# Patient Record
Sex: Female | Born: 1985 | Race: White | Hispanic: No | Marital: Single | Smoking: Never smoker
Health system: Southern US, Community
[De-identification: ages and names within clinical notes are randomized; demographics above are authoritative.]

## PROBLEM LIST (undated history)

## (undated) DIAGNOSIS — Z8619 Personal history of other infectious and parasitic diseases: Secondary | ICD-10-CM

## (undated) HISTORY — DX: Personal history of other infectious and parasitic diseases: Z86.19

---
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
## 2011-03-19 ENCOUNTER — Encounter: Payer: Self-pay | Admitting: Internal Medicine

## 2011-03-19 ENCOUNTER — Ambulatory Visit (INDEPENDENT_AMBULATORY_CARE_PROVIDER_SITE_OTHER): Payer: BC Managed Care – PPO | Admitting: Internal Medicine

## 2011-03-19 ENCOUNTER — Ambulatory Visit: Payer: Self-pay | Admitting: Internal Medicine

## 2011-03-19 VITALS — BP 120/80 | HR 60 | Ht 65.5 in | Wt 140.0 lb

## 2011-03-19 DIAGNOSIS — Z309 Encounter for contraceptive management, unspecified: Secondary | ICD-10-CM

## 2011-03-19 DIAGNOSIS — Z113 Encounter for screening for infections with a predominantly sexual mode of transmission: Secondary | ICD-10-CM

## 2011-03-19 DIAGNOSIS — IMO0001 Reserved for inherently not codable concepts without codable children: Secondary | ICD-10-CM | POA: Insufficient documentation

## 2011-03-19 DIAGNOSIS — Z0000 Encounter for general adult medical examination without abnormal findings: Secondary | ICD-10-CM

## 2011-03-19 LAB — POCT URINALYSIS DIPSTICK
Bilirubin, UA: NEGATIVE
Glucose, UA: NEGATIVE
Ketones, UA: NEGATIVE
Leukocytes, UA: NEGATIVE
Protein, UA: NEGATIVE

## 2011-03-19 LAB — CBC WITH DIFFERENTIAL/PLATELET
Eosinophils Relative: 0.9 % (ref 0.0–5.0)
HCT: 37.8 % (ref 36.0–46.0)
Hemoglobin: 13 g/dL (ref 12.0–15.0)
Lymphs Abs: 1.1 10*3/uL (ref 0.7–4.0)
MCV: 92.5 fl (ref 78.0–100.0)
Monocytes Absolute: 0.2 10*3/uL (ref 0.1–1.0)
Monocytes Relative: 5.4 % (ref 3.0–12.0)
Neutro Abs: 2.9 10*3/uL (ref 1.4–7.7)
RDW: 12.5 % (ref 11.5–14.6)
WBC: 4.3 10*3/uL — ABNORMAL LOW (ref 4.5–10.5)

## 2011-03-19 LAB — HEPATIC FUNCTION PANEL
ALT: 16 U/L (ref 0–35)
AST: 18 U/L (ref 0–37)
Albumin: 4.3 g/dL (ref 3.5–5.2)

## 2011-03-19 LAB — TSH: TSH: 1.18 u[IU]/mL (ref 0.35–5.50)

## 2011-03-19 LAB — BASIC METABOLIC PANEL
BUN: 15 mg/dL (ref 6–23)
CO2: 29 mEq/L (ref 19–32)
Chloride: 103 mEq/L (ref 96–112)
Glucose, Bld: 69 mg/dL — ABNORMAL LOW (ref 70–99)
Potassium: 4 mEq/L (ref 3.5–5.1)

## 2011-03-19 LAB — LIPID PANEL: Cholesterol: 143 mg/dL (ref 0–200)

## 2011-03-19 MED ORDER — NORETHINDRONE ACET-ETHINYL EST 1-20 MG-MCG PO TABS: 1.0000 | ORAL_TABLET | Freq: Every day | ORAL | Status: DC

## 2011-03-19 NOTE — Patient Instructions (Signed)
We'll let you know about lab tests. Recommend followup visit to do Pap smear and medication check in about 3 months. Return for any immunizations needed and PPD  As needed.

## 2011-03-19 NOTE — Progress Notes (Signed)
Subjective:    Patient ID: Barbara Cuevas, female    DOB: 05-13-1986, 25 y.o.   MRN: [id number]  HPI Patient comes in today to establish as a new patient visit. She has not been seen here in years. 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  She has no major illnesses chronic disease is and is not on any regular medication. Nonsmoker some alcohol. 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  She had chickenpox as a child. She states she is up-to-date on her immunization but it is not in the registry and she does not have the record today. Last TB test was over a year ago and was negative.   Has a drivers license, wears seatbelts occasionally uses tanning beds but is decreasing this. It's calcium vitamin D in her diet. Non-smoker .   I sp uses condoms   No other contraception. NO gi gu issues ..never had a PAP.  Past Medical History  Diagnosis Date  . Hx of varicella    History reviewed. No pertinent past surgical history.  reports that she has never smoked. She does not have any smokeless tobacco history on file. She reports that she drinks alcohol. She reports that she does not use illicit drugs. family history includes Arthritis in an unspecified family member; Diabetes type II in an unspecified family member; and Heart disease in her father. No Known Allergies  Review of Systems 12 system review negative.  Periods last 5-7 days has developed 8-10 a year. No migraines nonsmoker. Remote history of headaches history of UTI    Objective:   Physical Exam Physical Exam: Vital signs reviewed [id number]:[id number] is a well-developed well-nourished alert cooperative  white female who appears her stated age in no acute distress.  HEENT: normocephalic  traumatic , Eyes: PERRL EOM's full, conjunctiva clear, Nares: paten,t no deformity discharge or tenderness., Ears: no deformity EAC's clear TMs with normal landmarks.  Mouth: clear OP, no lesions, edema.  Moist mucous membranes. Dentition in adequate repair. NECK: supple without masses, thyromegaly or bruits. CHEST/PULM:  Clear to auscultation and percussion breath sounds equal no wheeze , rales or rhonchi. No chest wall deformities or tenderness. Breast: normal by inspection . No dimpling, discharge, masses, tenderness or discharge . LN: no cervical axillary inguinal adenopathy CV: PMI is nondisplaced, S1 S2 no gallops, murmurs, rubs. Peripheral pulses are full without delay.No JVD .  ABDOMEN: Bowel sounds normal nontender  No guard or rebound, no hepato splenomegal no CVA tenderness.  No hernia. Extremtities:  No clubbing cyanosis or edema, no acute joint swelling or redness no focal atrophy NEURO:  Oriented x3, cranial nerves 3-12 appear to be intact, no obvious focal weakness,gait within normal limits no abnormal reflexes or asymmetrical SKIN: No acute rashes normal turgor, color, no bruising or petechiae. PSYCH: Oriented, good eye contact, no obvious depression anxiety, cognition and judgment appear normal. Form reviewed       Assessment & Plan:  Preventive Health Care Get immunization records plan PPD when she can return for reading.  Counseled about healthy diet ,nutrition, exercise, sun protection, calcium vitamin D and injury prevention.  She should also stop the tanning bed  Contraceptive need   She should continue condoms but if she is interested we have reviewed and given her prescription for oral contraceptives. She is an acceptable candidate. Risk-benefit discussed. She should follow up into three  months for blood pressure check Pap smear and medicine check.  Form completed no restrictions.  To return for ppd .   Labs done cpx with varicella titer and sti screening.

## 2011-03-20 LAB — CHLAMYDIA PROBE AMPLIFICATION, URINE: Chlamydia, Swab/Urine, PCR: NEGATIVE

## 2011-03-23 ENCOUNTER — Ambulatory Visit (INDEPENDENT_AMBULATORY_CARE_PROVIDER_SITE_OTHER): Payer: BC Managed Care – PPO | Admitting: Internal Medicine

## 2011-03-23 ENCOUNTER — Telehealth: Payer: Self-pay | Admitting: *Deleted

## 2011-03-23 DIAGNOSIS — Z111 Encounter for screening for respiratory tuberculosis: Secondary | ICD-10-CM

## 2011-03-23 NOTE — Telephone Encounter (Signed)
Left message for pt to call back  °

## 2011-03-23 NOTE — Telephone Encounter (Signed)
Message copied by Tor Netters on Mon Mar 23, 2011  2:35 PM ------      Message from: [facility], [place] [facility]      Created: Mon Mar 23, 2011  1:31 PM       Advised patient her labs are normal. She is immune to chickenpox in her screening tests are negative ( you can give her a copy any minor abnormalities on the screens are of no clinical significance.)

## 2011-03-25 ENCOUNTER — Ambulatory Visit (INDEPENDENT_AMBULATORY_CARE_PROVIDER_SITE_OTHER): Payer: BC Managed Care – PPO | Admitting: Internal Medicine

## 2011-03-25 DIAGNOSIS — Z0000 Encounter for general adult medical examination without abnormal findings: Secondary | ICD-10-CM

## 2011-03-25 DIAGNOSIS — Z23 Encounter for immunization: Secondary | ICD-10-CM

## 2011-03-25 LAB — TB SKIN TEST

## 2011-03-25 NOTE — Telephone Encounter (Signed)
Pt aware of results 

## 2011-04-01 ENCOUNTER — Encounter: Payer: Self-pay | Admitting: *Deleted

## 2011-06-18 ENCOUNTER — Ambulatory Visit: Payer: BC Managed Care – PPO | Admitting: Internal Medicine

## 2011-06-26 ENCOUNTER — Ambulatory Visit (INDEPENDENT_AMBULATORY_CARE_PROVIDER_SITE_OTHER): Payer: BC Managed Care – PPO | Admitting: Internal Medicine

## 2011-06-26 DIAGNOSIS — Z23 Encounter for immunization: Secondary | ICD-10-CM

## 2011-07-16 ENCOUNTER — Other Ambulatory Visit: Payer: Self-pay | Admitting: Internal Medicine

## 2011-07-16 MED ORDER — NORETHINDRONE ACET-ETHINYL EST 1-20 MG-MCG PO TABS: ORAL_TABLET | ORAL | Status: DC

## 2011-07-16 NOTE — Telephone Encounter (Signed)
Rx sent to pharmacy   

## 2011-07-16 NOTE — Telephone Encounter (Signed)
Pt req refill of norethindrone-ethinyl estradiol (MICROGESTIN 1/20) 1-20 MG-MCG to [facility] in [place], [place]

## 2012-07-19 ENCOUNTER — Ambulatory Visit: Payer: BC Managed Care – PPO | Admitting: Internal Medicine

## 2012-07-21 ENCOUNTER — Encounter: Payer: Self-pay | Admitting: Internal Medicine

## 2012-07-21 ENCOUNTER — Ambulatory Visit (INDEPENDENT_AMBULATORY_CARE_PROVIDER_SITE_OTHER): Payer: BC Managed Care – PPO | Admitting: Internal Medicine

## 2012-07-21 VITALS — BP 112/80 | HR 70 | Temp 98.4°F | Wt 152.0 lb

## 2012-07-21 DIAGNOSIS — R41840 Attention and concentration deficit: Secondary | ICD-10-CM | POA: Insufficient documentation

## 2012-07-21 DIAGNOSIS — Z309 Encounter for contraceptive management, unspecified: Secondary | ICD-10-CM

## 2012-07-21 DIAGNOSIS — Z8659 Personal history of other mental and behavioral disorders: Secondary | ICD-10-CM | POA: Insufficient documentation

## 2012-07-21 HISTORY — DX: Encounter for contraceptive management, unspecified: Z30.9

## 2012-07-21 MED ORDER — NORETHINDRONE ACET-ETHINYL EST 1-20 MG-MCG PO TABS: ORAL_TABLET | ORAL | Status: DC

## 2012-07-21 NOTE — Progress Notes (Signed)
Subjective:    Patient ID: Barbara Cuevas, female    DOB: 07/22/1986, 26 y.o.   MRN: [id number]  HPI Patient comes in for an acute visit  appt time for new problem; last office visit was over a year ago 4/ 12 . Note review showed  Plan to come back for Pap smear in a few months . Still hasn't had a pap and ran out of OCPS .  1 partner used condoms.   She is generally been well; however is now in  nursing school  [employer] and concerned about her ability to be able to  study without distraction. She doesn't really want to be on medicine for her brother had significant ADD with hyperactivity as a young child. Wonders if she has this problem. Never had to study much  To keep up  in HS and did well. As  B's in HS  but never had to take standardized test such as the SAT or AP Then   Never had to study a lot until recently.   First week of school . And decided to come in to discuss this Took anatomy 3 times and finally passed it but she  had difficulty passing.  Does  not she feels from Concept understanding  but because it was difficult to sit down and concentrate without distraction and memorize..  She is also working .  Sleep ok . No significant alcohol tobacco or D. some caffeine. Lives at home    Attached appt.  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    20  Hours per week.    Fails to finish things a lot  All the time .  worse recently does have some stress with a number of things she has to do but denies specific anxiety depression panic attacks. No history of head injuries or headaches. In regard to work in high school with AP courses English she did better with other students around her to make her focus and concentrate on left on her own and had more difficulty.    .  Review of Systems Negative for chest pain shortness of breath vision hearing changes syncope unusual bleeding. Has been on Loestrin and ran out using a for period control and  contraception using condoms at present. Had no side effects would like refill.  Past history family history social history reviewed in the electronic medical record. No FM HX sd arrythmia  Father cabg in 59s     Objective:   Physical Exam BP 112/80  Pulse 70  Temp 98.4 F (36.9 C) (Oral)  Wt 152 lb (68.947 kg)  SpO2 99%  LMP 06/13/2012  Physical Exam: Vital signs reviewed [id number] is a well-developed well-nourished alert cooperative  white female who appears her stated age in no acute distress.  HEENT: normocephalic atraumatic , Eyes: PERRL EOM's full, conjunctiva clear, Nares: paten,t no deformity discharge or tenderness., Ears: no deformity EAC's clear TMs with normal landmarks. Mouth: clear OP, no lesions, edema.  Moist mucous membranes. Dentition in adequate repair. NECK: supple without masses, thyromegaly or bruits. CHEST/PULM:  Clear to auscultation and percussion breath sounds equal no wheeze , rales or rhonchi. No chest wall deformities or tenderness. CV: PMI is nondisplaced, S1 S2 no gallops, murmurs, rubs. Peripheral pulses are full without delay.No JVD .  ABDOMEN: Bowel sounds normal nontender  No guard or rebound, no hepato splenomegal no CVA tenderness.  Extremtities:  No clubbing  cyanosis or edema, no acute joint swelling or redness no focal atrophy NEURO:  Oriented x3, cranial nerves 3-12 appear to be intact, no obvious focal weakness,gait within normal limits no abnormal reflexes or asymmetrical SKIN: No acute rashes normal turgor, color, no bruising or petechiae. PSYCH: Oriented, good eye contact, no obvious depression anxiety, cognition and judgment appear normal. 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 It is  always possible she has an LD but it does not appear that she has a constant problem or an attentional problem.  On the surface it doesn't meet the criteria significant difficulty in  2 areas of her life however on further  description she does have a hard time getting things done has to write everything down.has to push for deadlines  In comparison to her brother the attention was given to him when growing up as more of a problem. To complete the adult scale  And have on return   Contraceptive need acceptable candidate for medicine we'll refill her medicine for 5 months have her come back in 1 month or less with adult scale ADHD screen and we'll do a Pap and pelvic at that time. She's never had a Pap smear.   Total visit > 50% spent counseling and coordinating care

## 2012-07-21 NOTE — Patient Instructions (Signed)
Okay to restart the birth control pills. Fill out the survey   Recheck in 3-4 weeks can do Pap and reevaluate for other interventions.  It is possible you have attentional difficulties but we need more information you might benefit from medication.  Is important that you get adequate sleep nutrition as this affects concentration.

## 2012-08-02 ENCOUNTER — Encounter: Payer: Self-pay | Admitting: Internal Medicine

## 2012-08-02 ENCOUNTER — Ambulatory Visit (INDEPENDENT_AMBULATORY_CARE_PROVIDER_SITE_OTHER): Payer: BC Managed Care – PPO | Admitting: Internal Medicine

## 2012-08-02 ENCOUNTER — Other Ambulatory Visit (HOSPITAL_COMMUNITY)
Admission: RE | Admit: 2012-08-02 | Discharge: 2012-08-02 | Disposition: A | Discharge status: Home / Self Care | Payer: Self-pay | Source: Ambulatory Visit | Attending: Internal Medicine | Admitting: Internal Medicine

## 2012-08-02 VITALS — BP 104/64 | HR 83 | Temp 98.5°F | Ht 66.75 in | Wt 151.0 lb

## 2012-08-02 DIAGNOSIS — Z01419 Encounter for gynecological examination (general) (routine) without abnormal findings: Secondary | ICD-10-CM | POA: Insufficient documentation

## 2012-08-02 DIAGNOSIS — R41840 Attention and concentration deficit: Secondary | ICD-10-CM

## 2012-08-02 DIAGNOSIS — Z113 Encounter for screening for infections with a predominantly sexual mode of transmission: Secondary | ICD-10-CM | POA: Insufficient documentation

## 2012-08-02 DIAGNOSIS — F988 Other specified behavioral and emotional disorders with onset usually occurring in childhood and adolescence: Secondary | ICD-10-CM

## 2012-08-02 DIAGNOSIS — Z0000 Encounter for general adult medical examination without abnormal findings: Secondary | ICD-10-CM

## 2012-08-02 DIAGNOSIS — Z309 Encounter for contraceptive management, unspecified: Secondary | ICD-10-CM

## 2012-08-02 MED ORDER — AMPHETAMINE-DEXTROAMPHET ER 10 MG PO CP24: 10.0000 mg | ORAL_CAPSULE | ORAL | Status: DC

## 2012-08-02 NOTE — Progress Notes (Signed)
  Subjective:    Patient ID: Barbara Cuevas, female    DOB: [birth year], 26 y.o.   MRN: [id number]  HPI Patient comes in today for followup Pap and pelvic exam and consideration for ADHD attentional problems. She has begun the oral contraceptive since last time without significant side effect. She denies any breast lumps or vaginal symptoms. Has never had a Pap and pelvic. One partner condoms  She returns with see ASR S. symptom checklist.   Review of Systems Negative chest pain shortness of breath asthma see past history  problems with concentration reading memory is only distracted. Family history of ADHD  .  Past history family history social history reviewed in the electronic medical record.    Objective:   Physical Exam BP 104/64  Pulse 83  Temp 98.5 F (36.9 C) (Oral)  Ht 5' 6.75" (1.695 m)  Wt 151 lb (68.493 kg)  BMI 23.83 kg/m2  SpO2 98%  LMP 06/13/2012  Well-developed well-nourished in no acute distress cardiac S1-S2 no gallops or murmurs breasts intact without discharge dimpling or discharge. Abdomen soft without organomegaly guarding or rebound Chest:  Clear to A&P without wheezes rales or rhonchi CV:  S1-S2 no gallops or murmurs peripheral perfusion is normal Pelvic:  External GU normal cervix posterior deep looks normal mild vaginal discharge Pap done bimanual negative CMT adnexa clear no masses felt. Neurologic nonfocal no fidgeting normal eye contact. See ASR S. symptom checklist  ASRS check list    4/6 in significant  Level   6/18 sx list of sx     Assessment & Plan:  Well woman exam normal Pap STI screen Oral contraceptives monitoring continue.  Attentional difficulties family history difficulties in the past for not significant because she was never challenged to sit down and study and now is having difficulties concentrating does not appear to be from anxiety psychiatric disease or other cause. Discussed option of medicationwe can try low dose Adderall XR  10 mg  which may not be enough increase as tolerated we also discussed psychoeducational testing or evaluation for strength and weaknesses rule out processing speed defects.   Will consider this if we are not getting anywhere with medication.Discussed risk benefit of medication.  All the implications of using a schedule 2 drug.  Prescription security et Karie Soda. Patient lives at home and is well aware.

## 2012-08-02 NOTE — Patient Instructions (Signed)
Will contact you when pap results back. Can start the adderrall xr as discussed at a low dose . ROV in about a month. If no effect or concern about side effects .  Call earlier .and consideration of dose adjustment.

## 2012-08-04 ENCOUNTER — Encounter: Payer: Self-pay | Admitting: Internal Medicine

## 2012-08-06 DIAGNOSIS — F988 Other specified behavioral and emotional disorders with onset usually occurring in childhood and adolescence: Secondary | ICD-10-CM | POA: Insufficient documentation

## 2012-08-06 DIAGNOSIS — Z0000 Encounter for general adult medical examination without abnormal findings: Secondary | ICD-10-CM | POA: Insufficient documentation

## 2012-08-08 NOTE — Progress Notes (Signed)
Quick Note:  Tell patient PAP is normal. Neg STi screen There were some yeast So if has vaginitis sx such as dischage itching Treat with OTC miconizole vaginal or similar Medications. ______

## 2012-08-23 ENCOUNTER — Ambulatory Visit: Payer: BC Managed Care – PPO | Admitting: Internal Medicine

## 2013-02-01 ENCOUNTER — Other Ambulatory Visit (INDEPENDENT_AMBULATORY_CARE_PROVIDER_SITE_OTHER): Payer: BC Managed Care – PPO | Admitting: Family Medicine

## 2013-02-01 ENCOUNTER — Ambulatory Visit: Payer: BC Managed Care – PPO | Admitting: Family Medicine

## 2013-02-01 DIAGNOSIS — Z111 Encounter for screening for respiratory tuberculosis: Secondary | ICD-10-CM

## 2013-02-01 MED ORDER — AMPHETAMINE-DEXTROAMPHET ER 10 MG PO CP24: 10.0000 mg | ORAL_CAPSULE | ORAL | Status: DC

## 2013-02-03 ENCOUNTER — Ambulatory Visit: Payer: BC Managed Care – PPO | Admitting: Internal Medicine

## 2013-02-10 ENCOUNTER — Ambulatory Visit: Payer: BC Managed Care – PPO | Admitting: Internal Medicine

## 2013-02-10 ENCOUNTER — Ambulatory Visit (INDEPENDENT_AMBULATORY_CARE_PROVIDER_SITE_OTHER): Payer: BC Managed Care – PPO | Admitting: Internal Medicine

## 2013-02-10 ENCOUNTER — Encounter: Payer: Self-pay | Admitting: Internal Medicine

## 2013-02-10 VITALS — BP 94/72 | HR 82 | Temp 99.0°F | Wt 154.0 lb

## 2013-02-10 DIAGNOSIS — Z111 Encounter for screening for respiratory tuberculosis: Secondary | ICD-10-CM

## 2013-02-10 DIAGNOSIS — F988 Other specified behavioral and emotional disorders with onset usually occurring in childhood and adolescence: Secondary | ICD-10-CM

## 2013-02-10 DIAGNOSIS — Z79899 Other long term (current) drug therapy: Secondary | ICD-10-CM

## 2013-02-10 MED ORDER — AMPHETAMINE-DEXTROAMPHET ER 10 MG PO CP24: 10.0000 mg | ORAL_CAPSULE | ORAL | Status: DC

## 2013-02-10 NOTE — Progress Notes (Signed)
Chief Complaint  Patient presents with  . Follow-up    Meds.  Also needs a tb skin test  . ADHD    HPI: Here today  For See last visit 9 13  Never came back fro fu  Because  No insuance and couldn't get med filled    and then  Had  Weather related cancellations  Adhd: mom didn't have  insurance in between .  Visits  just started taking prn recently "only when needed .  Less than 10 days so far .  School nursing school  .   Hard to take tests and studying .  A bit better with focus when takes and less distraction.     Didn't do that well last semester  No significant side effects such as major sleep issues and mood changes, chest pain, shortness of breath, headaches , GI or significant weight loss.  ROS: See pertinent positives and negatives per HPI. No cp sob bleeding depression   Neg tad   Past Medical History  Diagnosis Date  . Hx of varicella     Family History  Problem Relation Age of Onset  . Heart disease Father 26    Open-heart surgery  . Diabetes type II      Parent,Grandparent  . Arthritis      Parent  . ADD / ADHD Brother     History   Social History  . Marital Status: Single    Spouse Name: N/A    Number of Children: N/A  . Years of Education: N/A   Social History Main Topics  . Smoking status: Never Smoker   . Smokeless tobacco: None  . Alcohol Use: Yes     Comment: socially  . Drug Use: No  . Sexually Active: None   Other Topics Concern  . None   Social History Narrative   Currently working  [employer]. 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   Household of 1   Lives and a residence attached to her family's house household of five   Regular sleep  8 hours    neg ets firearms.    Outpatient Encounter Prescriptions as of 02/10/2013  Medication Sig Dispense Refill  . amphetamine-dextroamphetamine (ADDERALL XR) 10 MG 24 hr capsule Take 1 capsule (10 mg total) by mouth every morning.  30 capsule  0  . [DISCONTINUED] amphetamine-dextroamphetamine  (ADDERALL XR) 10 MG 24 hr capsule Take 1 capsule (10 mg total) by mouth every morning.  30 capsule  0  . [DISCONTINUED] norethindrone-ethinyl estradiol (MICROGESTIN,JUNEL,LOESTRIN) 1-20 MG-MCG tablet 1 daily. Pt needs to schedule a follow up appointment before next refill.  28 tablet  5   No facility-administered encounter medications on file as of 02/10/2013.    EXAM:  BP 94/72  Pulse 82  Temp(Src) 99 F (37.2 C) (Oral)  Wt 154 lb (69.854 kg)  BMI 24.31 kg/m2  SpO2 98%  LMP 12/13/2012  Body mass index is 24.31 kg/(m^2).  GENERAL: vitals reviewed and listed above, alert, oriented, appears well hydrated and in no acute distress  HEENT: atraumatic, conjunctiva  clear, no obvious abnormalities on inspection of external nose and ears   NECK: no obvious masses on inspection palpation  noadenopathy  LUNGS: clear to auscultation bilaterally, no wheezes, rales or rhonchi, good air movement  CV: HRRR, no clubbing cyanosis or  peripheral edema nl cap refill  Abdomen:  Sof,t normal bowel sounds without hepatosplenomegaly, no guarding rebound or masses no CVA tenderness  MS: moves all extremities without noticeable focal  abnormality  PSYCH: pleasant and cooperative, no obvious depression or anxiety  ASSESSMENT AND PLAN:  Discussed the following assessment and plan:  ADD (attention deficit disorder) probable   Visit for TB skin test - Plan: TB Skin Test Sx still c/w add adhd    Disc trial use of med  On a regular basis  Is more beneficial to most people  .  Currently no se   Will continue on low dose 10 xr for now and contact us after 3-4 weeks  with option to increase to 15 gm xr  On renewal .   Counseled. Agree more benefit in trial than risk for her . [facility]  May be  helpful -Patient advised to return or notify health care team  if symptoms worsen or persist or new concerns arise.  Patient Instructions  I suggest taking most days  To help with the  attentional problems   Tasks  Every day.     adderall x r 10 mg      Contact us in 3-4 weeks when you are due for refill of about whether you want to remain on the 10 mg or increase to 15 mg XR.   ROV in 2-3 months or as needed.    Neta Mends. Panosh M.D.

## 2013-02-10 NOTE — Patient Instructions (Addendum)
I suggest taking most days  To help with the attentional problems   Tasks  Every day.     adderall x r 10 mg      Contact us in 3-4 weeks when you are due for refill of about whether you want to remain on the 10 mg or increase to 15 mg XR.   ROV in 2-3 months or as needed.

## 2013-02-11 ENCOUNTER — Encounter: Payer: Self-pay | Admitting: Internal Medicine

## 2013-02-11 DIAGNOSIS — Z79899 Other long term (current) drug therapy: Secondary | ICD-10-CM | POA: Insufficient documentation

## 2013-02-24 ENCOUNTER — Encounter: Payer: Self-pay | Admitting: Internal Medicine

## 2013-04-11 ENCOUNTER — Telehealth: Payer: Self-pay | Admitting: Internal Medicine

## 2013-04-11 NOTE — Telephone Encounter (Signed)
Pt stated MD will increase her generic adderall xr 10 mg to 15 mg. Pt stated she was communicating with MD on my chart. Please advise

## 2013-04-12 ENCOUNTER — Other Ambulatory Visit: Payer: Self-pay | Admitting: Family Medicine

## 2013-04-12 MED ORDER — AMPHETAMINE-DEXTROAMPHET ER 15 MG PO CP24: 15.0000 mg | ORAL_CAPSULE | ORAL | Status: DC

## 2013-04-12 NOTE — Telephone Encounter (Signed)
Yes  adderall x r 15  Disp 30

## 2013-04-12 NOTE — Telephone Encounter (Signed)
Left message on personally identified cell informing the patient to pick up at the front desk.

## 2013-06-16 ENCOUNTER — Other Ambulatory Visit: Payer: Self-pay | Admitting: Internal Medicine

## 2013-06-16 ENCOUNTER — Encounter: Payer: Self-pay | Admitting: Internal Medicine

## 2013-06-16 MED ORDER — AMPHETAMINE-DEXTROAMPHET ER 15 MG PO CP24: 15.0000 mg | ORAL_CAPSULE | ORAL | Status: DC

## 2013-06-16 NOTE — Telephone Encounter (Signed)
Ok to refill  30 adderall xr 15 mg  .  Plan fu appt    In September

## 2013-06-26 ENCOUNTER — Telehealth: Payer: Self-pay | Admitting: Internal Medicine

## 2013-06-26 NOTE — Telephone Encounter (Signed)
Pt needs new rx generic adderall xr 15 mg °

## 2013-06-28 ENCOUNTER — Other Ambulatory Visit: Payer: Self-pay | Admitting: Family Medicine

## 2013-06-28 MED ORDER — AMPHETAMINE-DEXTROAMPHET ER 15 MG PO CP24: 15.0000 mg | ORAL_CAPSULE | ORAL | Status: DC

## 2013-06-28 NOTE — Telephone Encounter (Signed)
Left message at below listed number for the pt to pick up at the front desk. 

## 2013-08-10 ENCOUNTER — Encounter: Payer: Self-pay | Admitting: Internal Medicine

## 2013-08-10 ENCOUNTER — Ambulatory Visit (INDEPENDENT_AMBULATORY_CARE_PROVIDER_SITE_OTHER): Payer: BC Managed Care – PPO | Admitting: Internal Medicine

## 2013-08-10 VITALS — BP 104/72 | HR 99 | Temp 97.9°F | Wt 142.0 lb

## 2013-08-10 DIAGNOSIS — Z23 Encounter for immunization: Secondary | ICD-10-CM

## 2013-08-10 DIAGNOSIS — Z79899 Other long term (current) drug therapy: Secondary | ICD-10-CM

## 2013-08-10 DIAGNOSIS — F988 Other specified behavioral and emotional disorders with onset usually occurring in childhood and adolescence: Secondary | ICD-10-CM

## 2013-08-10 MED ORDER — AMPHETAMINE-DEXTROAMPHET ER 15 MG PO CP24: 15.0000 mg | ORAL_CAPSULE | ORAL | Status: DC

## 2013-08-10 NOTE — Patient Instructions (Signed)
Continue same med  Contact us for  Refill  In 3 months  Preventive visit and med check in 6 months

## 2013-08-10 NOTE — Progress Notes (Signed)
Chief Complaint  Patient presents with  . Follow-up    HPI: Comes in today for followup of ADHD medication. Since her last visit she has done pretty well on Adderall XR taking most days but not always .No significant side effects such as major sleep issues and mood changes, chest pain, shortness of breath, headaches , GI or significant weight loss.  Better able to study and during test.   Got  86 on last testing and doing better. 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 ocass HA  No other isg se.  1 caffiene  No alcohol ROS: See pertinent positives and negatives per HPI. No chest pain shortness of breath mood disorder syncope exercise intolerance unusual bleeding. No risk of preg   No neuro sx .  Needs a flu vaccine for clinicals in school.  Past Medical History  Diagnosis Date  . Hx of varicella     Family History  Problem Relation Age of Onset  . Heart disease Father 38    Open-heart surgery  . Diabetes type II      Parent,Grandparent  . Arthritis      Parent  . ADD / ADHD Brother     History   Social History  . Marital Status: Single    Spouse Name: N/A    Number of Children: N/A  . Years of Education: N/A   Social History Main Topics  . Smoking status: Never Smoker   . Smokeless tobacco: None  . Alcohol Use: Yes     Comment: socially  . Drug Use: No  . Sexual Activity: None   Other Topics Concern  . None   Social History Narrative   Currently working  [employer].  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   Household of 1   Lives and a residence attached to her family's house household of five   Regular sleep  8 hours    neg ets firearms.    Outpatient Encounter Prescriptions as of 08/10/2013  Medication Sig Dispense Refill  . amphetamine-dextroamphetamine (ADDERALL XR) 15 MG 24 hr capsule Take 1 capsule (15 mg total) by mouth every morning.  30 capsule  0  . amphetamine-dextroamphetamine (ADDERALL XR) 15 MG  24 hr capsule Take 1 capsule (15 mg total) by mouth every morning.  30 capsule  0  . amphetamine-dextroamphetamine (ADDERALL XR) 15 MG 24 hr capsule Take 1 capsule (15 mg total) by mouth every morning.  30 capsule  0  . [DISCONTINUED] amphetamine-dextroamphetamine (ADDERALL XR) 15 MG 24 hr capsule Take 1 capsule (15 mg total) by mouth every morning.  30 capsule  0  . [DISCONTINUED] amphetamine-dextroamphetamine (ADDERALL XR) 15 MG 24 hr capsule Take 1 capsule (15 mg total) by mouth every morning.  30 capsule  0  . [DISCONTINUED] amphetamine-dextroamphetamine (ADDERALL XR) 15 MG 24 hr capsule Take 15 mg by mouth every morning.       No facility-administered encounter medications on file as of 08/10/2013.    EXAM:  BP 104/72  Pulse 99  Temp(Src) 97.9 F (36.6 C) (Oral)  Wt 142 lb (64.411 kg)  BMI 22.42 kg/m2  SpO2 98%  LMP 07/06/2013  Body mass index is 22.42 kg/(m^2).  GENERAL: vitals reviewed and listed above, alert, oriented, appears well hydrated and in no acute distress HEENT: atraumatic, conjunctiva  clear, no obvious abnormalities on inspection of external nose and ears ONECK: no obvious masses on inspection palpation no bruit or  adenopathy LUNGS: clear to auscultation bilaterally, no wheezes, rales or rhonchi, good air movement CV: HRRR, no clubbing cyanosis or  peripheral edema nl cap refill no gallops or murmurs Abdomen soft without organomegaly guarding or rebound MS: moves all extremities without noticeable focal  abnormality Neurologic cranial nerves III through XII appear intact nonfocal otherwise grossly normal PSYCH: pleasant and cooperative, no obvious depression or anxiety  ASSESSMENT AND PLAN:  Discussed the following assessment and plan:  ADD (attention deficit disorder) probable  - Significant functioning improvement in social and academic on medication without significant side effect. Continue her present meds  3 months given  Medication management  Need for  prophylactic vaccination and inoculation against influenza - Plan: Flu Vaccine QUAD 36+ mos PF IM (Fluarix) Adequate response to med . Side effect ;decrease appetite ,acceptable .Continue at same dose . ROV in 6 months for med check. PV  -Patient advised to return or notify health care team  if symptoms worsen or persist or new concerns arise.  Patient Instructions  Continue same med  Contact us for  Refill  In 3 months  Preventive visit and med check in 6 months      Neta Mends. Aedan Geimer M.D.

## 2013-12-04 ENCOUNTER — Other Ambulatory Visit: Payer: Self-pay | Admitting: Internal Medicine

## 2013-12-05 ENCOUNTER — Other Ambulatory Visit: Payer: Self-pay | Admitting: Family Medicine

## 2013-12-05 MED ORDER — AMPHETAMINE-DEXTROAMPHET ER 15 MG PO CP24: 15.0000 mg | ORAL_CAPSULE | ORAL | Status: DC

## 2014-02-09 ENCOUNTER — Encounter: Payer: BC Managed Care – PPO | Admitting: Internal Medicine

## 2014-03-16 ENCOUNTER — Other Ambulatory Visit: Payer: Self-pay | Admitting: Internal Medicine

## 2014-03-21 ENCOUNTER — Encounter (HOSPITAL_COMMUNITY): Payer: Self-pay | Admitting: Emergency Medicine

## 2014-03-21 DIAGNOSIS — N12 Tubulo-interstitial nephritis, not specified as acute or chronic: Secondary | ICD-10-CM | POA: Insufficient documentation

## 2014-03-21 DIAGNOSIS — Z3202 Encounter for pregnancy test, result negative: Secondary | ICD-10-CM | POA: Insufficient documentation

## 2014-03-21 DIAGNOSIS — Z79899 Other long term (current) drug therapy: Secondary | ICD-10-CM | POA: Insufficient documentation

## 2014-03-21 DIAGNOSIS — R000 Tachycardia, unspecified: Secondary | ICD-10-CM | POA: Insufficient documentation

## 2014-03-21 DIAGNOSIS — Z8619 Personal history of other infectious and parasitic diseases: Secondary | ICD-10-CM | POA: Insufficient documentation

## 2014-03-21 DIAGNOSIS — N390 Urinary tract infection, site not specified: Secondary | ICD-10-CM | POA: Insufficient documentation

## 2014-03-21 LAB — URINALYSIS, ROUTINE W REFLEX MICROSCOPIC
Glucose, UA: NEGATIVE mg/dL
KETONES UR: 15 mg/dL — AB
Nitrite: NEGATIVE
PH: 7.5 (ref 5.0–8.0)
PROTEIN: 100 mg/dL — AB
Specific Gravity, Urine: 1.03 (ref 1.005–1.030)
UROBILINOGEN UA: 1 mg/dL (ref 0.0–1.0)

## 2014-03-21 LAB — URINE MICROSCOPIC-ADD ON

## 2014-03-21 LAB — CBC
HEMATOCRIT: 38 % (ref 36.0–46.0)
Hemoglobin: 13.2 g/dL (ref 12.0–15.0)
MCH: 31.7 pg (ref 26.0–34.0)
MCHC: 34.7 g/dL (ref 30.0–36.0)
MCV: 91.3 fL (ref 78.0–100.0)
Platelets: 204 10*3/uL (ref 150–400)
RBC: 4.16 MIL/uL (ref 3.87–5.11)
RDW: 11.8 % (ref 11.5–15.5)
WBC: 20.6 10*3/uL — AB (ref 4.0–10.5)

## 2014-03-21 LAB — POC URINE PREG, ED: Preg Test, Ur: NEGATIVE

## 2014-03-21 MED ORDER — ACETAMINOPHEN 325 MG PO TABS
650.0000 mg | ORAL_TABLET | Freq: Once | ORAL | Status: AC
  Administered 2014-03-21: 650 mg via ORAL
  Filled 2014-03-21: qty 2

## 2014-03-21 NOTE — ED Notes (Addendum)
Pt reports she has been on period and has hx of having bad cramps but today pain became worse mostly in LLQ. Reports fever at home of 100.1. Took ibuprofen at 6pm with no relief. Pt reports last BM yesterday but feels like she can not have one today, also having nausea. Denies abnormal discharge.

## 2014-03-22 ENCOUNTER — Emergency Department (HOSPITAL_COMMUNITY)
Admission: EM | Admit: 2014-03-22 | Discharge: 2014-03-22 | Disposition: A | Discharge status: Home / Self Care | Payer: BC Managed Care – PPO | Attending: Emergency Medicine | Admitting: Emergency Medicine

## 2014-03-22 ENCOUNTER — Other Ambulatory Visit: Payer: Self-pay | Admitting: Family Medicine

## 2014-03-22 ENCOUNTER — Emergency Department (HOSPITAL_COMMUNITY): Payer: BC Managed Care – PPO

## 2014-03-22 DIAGNOSIS — N390 Urinary tract infection, site not specified: Secondary | ICD-10-CM

## 2014-03-22 LAB — COMPREHENSIVE METABOLIC PANEL
ALBUMIN: 4.2 g/dL (ref 3.5–5.2)
ALT: 8 U/L (ref 0–35)
AST: 14 U/L (ref 0–37)
Alkaline Phosphatase: 70 U/L (ref 39–117)
BUN: 11 mg/dL (ref 6–23)
CO2: 19 mEq/L (ref 19–32)
Calcium: 9.6 mg/dL (ref 8.4–10.5)
Chloride: 103 mEq/L (ref 96–112)
Creatinine, Ser: 0.68 mg/dL (ref 0.50–1.10)
GFR calc Af Amer: >90 mL/min (ref >90)
Glucose, Bld: 150 mg/dL — ABNORMAL HIGH (ref 70–99)
Potassium: 3.7 mEq/L (ref 3.7–5.3)
Sodium: 139 mEq/L (ref 137–147)
Total Bilirubin: 1.5 mg/dL — ABNORMAL HIGH (ref 0.3–1.2)
Total Protein: 7.4 g/dL (ref 6.0–8.3)

## 2014-03-22 LAB — I-STAT CG4 LACTIC ACID, ED: Lactic Acid, Venous: 1.2 mmol/L (ref 0.5–2.2)

## 2014-03-22 MED ORDER — CEFPODOXIME PROXETIL 100 MG PO TABS: 100.0000 mg | ORAL_TABLET | Freq: Two times a day (BID) | ORAL | Status: DC

## 2014-03-22 MED ORDER — ONDANSETRON HCL 4 MG/2ML IJ SOLN
4.0000 mg | Freq: Once | INTRAMUSCULAR | Status: AC
  Administered 2014-03-22: 4 mg via INTRAVENOUS
  Filled 2014-03-22: qty 2

## 2014-03-22 MED ORDER — DEXTROSE 5 % IV SOLN
1.0000 g | Freq: Once | INTRAVENOUS | Status: AC
  Administered 2014-03-22: 1 g via INTRAVENOUS
  Filled 2014-03-22: qty 10

## 2014-03-22 MED ORDER — ACETAMINOPHEN 325 MG PO TABS
650.0000 mg | ORAL_TABLET | Freq: Once | ORAL | Status: AC
  Administered 2014-03-22: 650 mg via ORAL
  Filled 2014-03-22: qty 2

## 2014-03-22 MED ORDER — KETOROLAC TROMETHAMINE 30 MG/ML IJ SOLN
30.0000 mg | Freq: Once | INTRAMUSCULAR | Status: AC
  Administered 2014-03-22: 30 mg via INTRAVENOUS
  Filled 2014-03-22: qty 1

## 2014-03-22 MED ORDER — SODIUM CHLORIDE 0.9 % IV BOLUS (SEPSIS)
1000.0000 mL | Freq: Once | INTRAVENOUS | Status: AC
  Administered 2014-03-22: 1000 mL via INTRAVENOUS

## 2014-03-22 MED ORDER — AMPHETAMINE-DEXTROAMPHET ER 15 MG PO CP24: 15.0000 mg | ORAL_CAPSULE | ORAL | Status: DC

## 2014-03-22 MED ORDER — ONDANSETRON HCL 4 MG PO TABS: 4.0000 mg | ORAL_TABLET | Freq: Four times a day (QID) | ORAL | Status: DC

## 2014-03-22 NOTE — ED Notes (Signed)
Discussed with the family that US results have to be reviewed by MD before discharge.

## 2014-03-22 NOTE — ED Notes (Signed)
Family at bedside. 

## 2014-03-22 NOTE — ED Notes (Signed)
Patient given ginger ale and instructed to report if nausous.

## 2014-03-22 NOTE — ED Notes (Addendum)
Patient "feels constipated".

## 2014-03-22 NOTE — Discharge Instructions (Signed)

## 2014-03-22 NOTE — ED Notes (Signed)
Discussed with Dr. Theodoro Kalataptiz that I-stat lactic was collected, and if blood cultures need to be collected prior to starting Rocephin.  MD advises not to collect at this time.

## 2014-03-22 NOTE — ED Notes (Signed)
Patient still off the unit for testing.  Provided warm blanket for family.

## 2014-03-22 NOTE — ED Notes (Signed)
No reports of nausea after fluid challenge.

## 2014-03-22 NOTE — ED Notes (Signed)
Discussed plan of care with Dr. Dierdre Highmanpitz.  Patient has ambulated in hallway, will continue to monitor VS.

## 2014-03-22 NOTE — ED Provider Notes (Signed)
CSN: [id number]     Arrival date & time 03/21/14  2214 History   First MD Initiated Contact with Patient 03/22/14 0131     Chief Complaint  Patient presents with  . Abdominal Pain     (Consider location/radiation/quality/duration/timing/severity/associated sxs/prior Treatment) HPI History provided by patient. Started feeling poorly yesterday with fever and chills and lower abdominal discomfort described as cramps and more left-sided. Current menses feels stronger than typical cramps.  No associated back pain. No difficulty urinating. She denies dysuria but does have some urgency and frequency.  No nausea or vomiting. Patient denies history of UTI or bladder infection in the past. She took Motrin this evening and for persistent symptoms presents here for evaluation. She denies any vaginal bleeding or discharge. No history of same.  Past Medical History  Diagnosis Date  . Hx of varicella    History reviewed. No pertinent past surgical history. Family History  Problem Relation Age of Onset  . Heart disease Father [id number]    Open-heart surgery  . Diabetes type II      Parent,Grandparent  . Arthritis      Parent  . ADD / ADHD Brother    History  Substance Use Topics  . Smoking status: Never Smoker   . Smokeless tobacco: Not on file  . Alcohol Use: Yes     Comment: socially   OB History   Grav Para Term Preterm Abortions TAB SAB Ect Mult Living   0 0 0 0 0 0 0 0 0 0      Review of Systems  Constitutional: Negative for fever and chills.  Eyes: Negative for pain.  Respiratory: Negative for shortness of breath.   Cardiovascular: Negative for chest pain.  Gastrointestinal: Positive for abdominal pain.  Genitourinary: Negative for flank pain.  Musculoskeletal: Negative for back pain.  Skin: Negative for rash.  Neurological: Negative for weakness and headaches.  All other systems reviewed and are negative.     Allergies  Review of patient's allergies indicates no known  allergies.  Home Medications   Prior to Admission medications   Medication Sig Start Date End Date Taking? Authorizing Provider  amphetamine-dextroamphetamine (ADDERALL XR) 15 MG 24 hr capsule Take 1 capsule (15 mg total) by mouth every morning. 12/05/13  Yes Madelin HeadingsWanda K Panosh, MD   BP 97/63  Pulse 96  Temp(Src) 98 F (36.7 C) (Oral)  Resp 23  Ht 5\' 3"  (1.6 m)  SpO2 100%  LMP 03/14/2014 Physical Exam  Constitutional: She is oriented to person, place, and time. She appears well-developed and well-nourished.  HENT:  Head: Normocephalic and atraumatic.  Eyes: EOM are normal. Pupils are equal, round, and reactive to light.  Neck: Neck supple.  Cardiovascular: Regular rhythm and intact distal pulses.   Tachycardic  Pulmonary/Chest: Effort normal and breath sounds normal. No respiratory distress.  Abdominal: Soft. Bowel sounds are normal. She exhibits no distension. There is no rebound and no guarding.  Left lower quadrant more so than midline tenderness without rebound or guarding. No right lower quadrant tenderness. No CVA tenderness.  Musculoskeletal: Normal range of motion. She exhibits no edema and no tenderness.  Neurological: She is alert and oriented to person, place, and time. No cranial nerve deficit.  Skin: Skin is warm and dry. No rash noted.    ED Course  Procedures (including critical care time) Labs Review Results for orders placed during the hospital encounter of 03/22/14  CBC      Result Value Ref Range  WBC 20.6 (*) 4.0 - 10.5 K/uL   RBC 4.16  3.87 - 5.11 MIL/uL   Hemoglobin 13.2  12.0 - 15.0 g/dL   HCT 16.1  09.6 - 04.5 %   MCV 91.3  78.0 - 100.0 fL   MCH 31.7  26.0 - 34.0 pg   MCHC 34.7  30.0 - 36.0 g/dL   RDW 40.9  81.1 - 91.4 %   Platelets 204  150 - 400 K/uL  COMPREHENSIVE METABOLIC PANEL      Result Value Ref Range   Sodium 139  137 - 147 mEq/L   Potassium 3.7  3.7 - 5.3 mEq/L   Chloride 103  96 - 112 mEq/L   CO2 19  19 - 32 mEq/L   Glucose, Bld 150  (*) 70 - 99 mg/dL   BUN 11  6 - 23 mg/dL   Creatinine, Ser 7.82  0.50 - 1.10 mg/dL   Calcium 9.6  8.4 - 95.6 mg/dL   Total Protein 7.4  6.0 - 8.3 g/dL   Albumin 4.2  3.5 - 5.2 g/dL   AST 14  0 - 37 U/L   ALT 8  0 - 35 U/L   Alkaline Phosphatase 70  39 - 117 U/L   Total Bilirubin 1.5 (*) 0.3 - 1.2 mg/dL   GFR calc non Af Amer >90  >90 mL/min   GFR calc Af Amer >90  >90 mL/min  URINALYSIS, ROUTINE W REFLEX MICROSCOPIC      Result Value Ref Range   Color, Urine AMBER (*) YELLOW   APPearance TURBID (*) CLEAR   Specific Gravity, Urine 1.030  1.005 - 1.030   pH 7.5  5.0 - 8.0   Glucose, UA NEGATIVE  NEGATIVE mg/dL   Hgb urine dipstick LARGE (*) NEGATIVE   Bilirubin Urine SMALL (*) NEGATIVE   Ketones, ur 15 (*) NEGATIVE mg/dL   Protein, ur 213 (*) NEGATIVE mg/dL   Urobilinogen, UA 1.0  0.0 - 1.0 mg/dL   Nitrite NEGATIVE  NEGATIVE   Leukocytes, UA LARGE (*) NEGATIVE  URINE MICROSCOPIC-ADD ON      Result Value Ref Range   Squamous Epithelial / LPF MANY (*) RARE   WBC, UA TOO NUMEROUS TO COUNT  <3 WBC/hpf   RBC / HPF 11-20  <3 RBC/hpf   Bacteria, UA MANY (*) RARE   Urine-Other MUCOUS PRESENT    POC URINE PREG, ED      Result Value Ref Range   Preg Test, Ur NEGATIVE  NEGATIVE  I-STAT CG4 LACTIC ACID, ED      Result Value Ref Range   Lactic Acid, Venous 1.20  0.5 - 2.2 mmol/L   US Transvaginal Non-ob  03/22/2014   CLINICAL DATA:  Left pelvic pain.  EXAM: TRANSABDOMINAL AND TRANSVAGINAL ULTRASOUND OF PELVIS  TECHNIQUE: Both transabdominal and transvaginal ultrasound examinations of the pelvis were performed. Transabdominal technique was performed for global imaging of the pelvis including uterus, ovaries, adnexal regions, and pelvic cul-de-sac. It was necessary to proceed with endovaginal exam following the transabdominal exam to visualize the ovaries and endometrium.  COMPARISON:  None  FINDINGS: Uterus  Measurements: 8.4 x 3.4 x 4.8 cm, anteverted. No fibroids or other mass visualized.   Endometrium  Thickness: 5 mm.  No focal abnormality visualized.  Right ovary  Measurements: 3.9 x 2.1 x 1.8 cm. Normal appearance/no adnexal mass.  Left ovary  Measurements: 3.5 x 2.3 x 2.8 cm. Normal appearance/no adnexal mass.  Other findings  No free pelvic  fluid collections. Incidentally images obtained of the bladder demonstrate filling defects layering in the bladder consistent with debris. This could indicate hemorrhage or infection.  IMPRESSION: Normal ultrasound appearance of the uterus and ovaries. Debris in the urinary bladder suggesting infection or hemorrhage.   Electronically Signed   By: Burman NievesWilliam  Stevens M.D.   On: 03/22/2014 04:02   [place] Pelvis Complete  03/22/2014   CLINICAL DATA:  Left pelvic pain.  EXAM: TRANSABDOMINAL AND TRANSVAGINAL ULTRASOUND OF PELVIS  TECHNIQUE: Both transabdominal and transvaginal ultrasound examinations of the pelvis were performed. Transabdominal technique was performed for global imaging of the pelvis including uterus, ovaries, adnexal regions, and pelvic cul-de-sac. It was necessary to proceed with endovaginal exam following the transabdominal exam to visualize the ovaries and endometrium.  COMPARISON:  None  FINDINGS: Uterus  Measurements: 8.4 x 3.4 x 4.8 cm, anteverted. No fibroids or other mass visualized.  Endometrium  Thickness: 5 mm.  No focal abnormality visualized.  Right ovary  Measurements: 3.9 x 2.1 x 1.8 cm. Normal appearance/no adnexal mass.  Left ovary  Measurements: 3.5 x 2.3 x 2.8 cm. Normal appearance/no adnexal mass.  Other findings  No free pelvic fluid collections. Incidentally images obtained of the bladder demonstrate filling defects layering in the bladder consistent with debris. This could indicate hemorrhage or infection.  IMPRESSION: Normal ultrasound appearance of the uterus and ovaries. Debris in the urinary bladder suggesting infection or hemorrhage.   Electronically Signed   By: Burman NievesWilliam  Stevens M.D.   On: 03/22/2014 04:02     IV  fluids and IV Rocephin provided. Tylenol, Toradol On recheck is feeling better and is tolerating by mouth fluids. Patient ambulates to the bathroom  6:08 AM requesting to be discharged home and is feeling much better.  Plan discharge home with outpatient followup. Prescription for antibiotics and antiemetics provided. Strict return precautions verbalized as understood.   MDM   Dx: UTI, early pyelonephritis  Evaluated with labs and imaging reviewed as above. She has significant leukocytosis and obvious UTI on urinalysis. Urine culture is pending. Treated with IV fluids and IV antibiotics. Tachycardia improved. Vital signs and nursing notes reviewed and considered. Systolic blood pressure 90s at times with normal lactate. Feel she is stable for discharge home and outpatient followup.     Sunnie NielsenBrian Jarold Macomber, MD 03/22/14 [phone]

## 2014-03-22 NOTE — ED Notes (Signed)
Warm blanket given

## 2014-03-22 NOTE — ED Notes (Signed)
Patient returned to room from US.  Ambulated to restroom independently.

## 2014-03-22 NOTE — ED Notes (Signed)
Patient transported to Ultrasound 

## 2014-03-23 NOTE — Telephone Encounter (Signed)
Left message to advise pt Rx ready for pick up 

## 2014-04-10 ENCOUNTER — Encounter: Payer: Self-pay | Admitting: Internal Medicine

## 2014-04-10 ENCOUNTER — Other Ambulatory Visit (HOSPITAL_COMMUNITY)
Admission: RE | Admit: 2014-04-10 | Discharge: 2014-04-10 | Disposition: A | Discharge status: Home / Self Care | Payer: BC Managed Care – PPO | Source: Ambulatory Visit | Attending: Internal Medicine | Admitting: Internal Medicine

## 2014-04-10 ENCOUNTER — Ambulatory Visit (INDEPENDENT_AMBULATORY_CARE_PROVIDER_SITE_OTHER): Payer: BC Managed Care – PPO | Admitting: Internal Medicine

## 2014-04-10 VITALS — BP 108/78 | HR 108 | Temp 98.4°F | Ht 66.25 in | Wt 130.0 lb

## 2014-04-10 DIAGNOSIS — Z79899 Other long term (current) drug therapy: Secondary | ICD-10-CM

## 2014-04-10 DIAGNOSIS — Z01419 Encounter for gynecological examination (general) (routine) without abnormal findings: Secondary | ICD-10-CM | POA: Insufficient documentation

## 2014-04-10 DIAGNOSIS — Z0000 Encounter for general adult medical examination without abnormal findings: Secondary | ICD-10-CM

## 2014-04-10 DIAGNOSIS — Z113 Encounter for screening for infections with a predominantly sexual mode of transmission: Secondary | ICD-10-CM | POA: Insufficient documentation

## 2014-04-10 DIAGNOSIS — H1013 Acute atopic conjunctivitis, bilateral: Secondary | ICD-10-CM | POA: Insufficient documentation

## 2014-04-10 DIAGNOSIS — F988 Other specified behavioral and emotional disorders with onset usually occurring in childhood and adolescence: Secondary | ICD-10-CM

## 2014-04-10 DIAGNOSIS — H1045 Other chronic allergic conjunctivitis: Secondary | ICD-10-CM

## 2014-04-10 MED ORDER — AMPHETAMINE-DEXTROAMPHET ER 15 MG PO CP24: 15.0000 mg | ORAL_CAPSULE | ORAL | Status: DC

## 2014-04-10 NOTE — Patient Instructions (Signed)
Continue lifestyle intervention healthy eating and exercise . See eye doc if  Having eye pain or not getting better .  Consider iud for contraception and see gyne. Will notify you  ofPAPwhen available. ROV in 6 months for med check.

## 2014-04-10 NOTE — Progress Notes (Signed)
Chief Complaint  Patient presents with  . Annual Exam  . ADHD  . Gynecologic Exam    HPI: Patient comes in today for Preventive Health Care visit  she was rx in ed in April 2015 for fever abd pain   Had US that was normal. uti esarly pyelo WBC 20 k  Had abd US  rx with ivf and rocephin for?  uti pyelopnepthitis  Ucx was orsered but apparently never sent completed or not in EHRFine now  alelrgies in eyes.  Has contacts. Used drops getting better  ZOX:[id number]:medication No significant side effects such as major sleep issues and mood changes, chest pain, shortness of breath, headaches , GI or significant weight loss. Periods :   Not on ocps .   Using condoms monogamous  No sx  Just graduated .to take tests nad look for job.  Exercise runs neg td   Health Maintenance  Topic Date Due  . Influenza Vaccine  06/30/2014  . Pap Smear  08/03/2015  . Tetanus/tdap  03/24/2021   Health Maintenance Review   ROS:  GEN/ HEENT: No fever, significant weight changes sweats headaches vision problems hearing changes, CV/ PULM; No chest pain shortness of breath cough, syncope,edema  change in exercise tolerance. GI /GU: No adominal pain, vomiting, change in bowel habits. No blood in the stool. No significant GU symptoms.today  SKIN/HEME: ,no acute skin rashes suspicious lesions or bleeding. No lymphadenopathy, nodules, masses.  NEURO/ PSYCH:  No neurologic signs such as weakness numbness. No depression anxiety. IMM/ Allergy: No unusual infections.  Allergy .   REST of 12 system review negative except as per HPI   Past Medical History  Diagnosis Date  . Hx of varicella     Family History  Problem Relation Age of Onset  . Heart disease Father 3055    Open-heart surgery  . Diabetes type II      Parent,Grandparent  . Arthritis      Parent  . ADD / ADHD Brother     History   Social History  . Marital Status: Single    Spouse Name: N/A    Number of Children: N/A  . Years of Education: N/A    Social History Main Topics  . Smoking status: Never Smoker   . Smokeless tobacco: None  . Alcohol Use: Yes     Comment: socially  . Drug Use: No  . Sexual Activity: None   Other Topics Concern  . None   Social History Narrative   Currently working  [employer] [facility] [employer]    n[employer]ing [employer] at [employer] may 15   Household of 1   Lives and a residence attached to her family's house household of five   Regular sleep  8 hours    neg ets firearms.    Outpatient Encounter Prescriptions as of 04/10/2014  Medication Sig  . amphetamine-dextroamphetamine (ADDERALL XR) 15 MG 24 hr capsule Take 1 capsule (15 mg total) by mouth every morning.  . [DISCONTINUED] amphetamine-dextroamphetamine (ADDERALL XR) 15 MG 24 hr capsule Take 1 capsule (15 mg total) by mouth every morning.  Marland Kitchen. amphetamine-dextroamphetamine (ADDERALL XR) 15 MG 24 hr capsule Take 1 capsule (15 mg total) by mouth every morning.  Marland Kitchen. amphetamine-dextroamphetamine (ADDERALL XR) 15 MG 24 hr capsule Take 1 capsule (15 mg total) by mouth every morning.  . [DISCONTINUED] cefpodoxime (VANTIN) 100 MG tablet Take 1 tablet (100 mg total) by mouth 2 (two) times daily.  . [DISCONTINUED] ondansetron (ZOFRAN) 4  MG tablet Take 1 tablet (4 mg total) by mouth every 6 (six) hours.    EXAM:  BP 108/78  Pulse 108  Temp(Src) 98.4 F (36.9 C) (Oral)  Ht 5' 6.25" (1.683 m)  Wt 130 lb (58.968 kg)  BMI 20.82 kg/m2  SpO2 98%  LMP 03/14/2014  Body mass index is 20.82 kg/(m^2).  Physical Exam: Vital signs reviewed NWG:NFAOGEN:This is a well-developed well-nourished alert cooperative    who appearsr stated age in no acute distress.  Reddish eyes no edema  HEENT: normocephalic atraumatic , Eyes: PERRL EOM's full, conjunctiva red no photophobia , Nares: paten,t no deformity discharge or tenderness., Ears: no deformity EAC's clear TMs with normal landmarks. Mouth: clear OP, no lesions, edema.  Moist mucous membranes. Dentition in adequate repair. NECK:  supple without masses, thyromegaly or bruits. CHEST/PULM:  Clear to auscultation and percussion breath sounds equal no wheeze , rales or rhonchi. No chest wall deformities or tenderness. Breast: normal by inspection . No dimpling, discharge, masses, tenderness or discharge . CV: PMI is nondisplaced, S1 S2 no gallops, murmurs, rubs. Peripheral pulses are full without delay.No JVD .  ABDOMEN: Bowel sounds normal nontender  No guard or rebound, no hepato splenomegal no CVA tenderness.  No hernia. Extremtities:  No clubbing cyanosis or edema, no acute joint swelling or redness no focal atrophy NEURO:  Oriented x3, cranial nerves 3-12 appear to be intact, no obvious focal weakness,gait within normal limits no abnormal reflexes or asymmetrical SKIN: No acute rashes normal turgor, color, no bruising or petechiae.some acne on face lower mostly  PSYCH: Oriented, good eye contact, no obvious depression anxiety, cognition and judgment appear normal. LN: no cervical axillary inguinal adenopathy Pelvic: NL ext GU, labia clear without lesions or rash . Vagina no lesions .Cervix: clear  UTERUS: Neg CMT Adnexa:  clear no masses . PAP done [facility] chl screen  Lab Results  Component Value Date   WBC 20.6* 03/21/2014   HGB 13.2 03/21/2014   HCT 38.0 03/21/2014   PLT 204 03/21/2014   GLUCOSE 150* 03/21/2014   CHOL 143 03/19/2011   TRIG 46.0 03/19/2011   HDL 58.70 03/19/2011   LDLCALC 75 03/19/2011   ALT 8 03/21/2014   AST 14 03/21/2014   NA 139 03/21/2014   K 3.7 03/21/2014   CL 103 03/21/2014   CREATININE 0.68 03/21/2014   BUN 11 03/21/2014   CO2 19 03/21/2014   TSH 1.18 03/19/2011    ASSESSMENT AND PLAN:  Discussed the following assessment and plan:  Encounter for preventive health examination - Plan: PAP [[place]]  Visit for gynecologic examination - Plan: PAP [[place]]  ADD (attention deficit disorder) probable  - contineu medication for now  Medication management  Allergic conjunctivitis of both  eyes S/p uti pyelo no sx today disc recheck early if any return of sx.  Adequate response to med . Side effect ;decrease appetite ,acceptable .Continue at same dose . ROV in  6 months for med check. benefit more than risk  Patient Care Team: Madelin HeadingsWanda K Kaydie Petsch, MD as PCP - General (Internal Medicine) Patient Instructions  Continue lifestyle intervention healthy eating and exercise . See eye doc if  Having eye pain or not getting better .  Consider iud for contraception and see gyne. Will notify you  ofPAPwhen available. ROV in 6 months for med check.      Neta MendsWanda K. Saige Busby M.D.  Pre visit review using our clinic review tool, if applicable. No additional management support is  needed unless otherwise documented below in the visit note.

## 2014-04-12 NOTE — Progress Notes (Signed)
Quick Note:  Tell patient PAP is normal. ______ 

## 2014-04-13 ENCOUNTER — Encounter: Payer: Self-pay | Admitting: Family Medicine

## 2014-04-13 ENCOUNTER — Telehealth: Payer: Self-pay | Admitting: Family Medicine

## 2014-04-13 NOTE — Telephone Encounter (Signed)
Sent a letter informing the patient that her pap is normal.  However, yeast was seen.  Need to find out if patient is sx.  [facility] pap is normal But there are yeast  If she is having a sx she can use OTC monistat For 3 -7 days

## 2014-04-16 NOTE — Telephone Encounter (Signed)
Tried reaching the patient and received a message that the mailbox is full.  Will try again at a later time.

## 2014-04-17 NOTE — Telephone Encounter (Signed)
Tried reaching the patient.  Received a message that the mailbox is full.  Will try again at a later time.

## 2014-04-18 NOTE — Telephone Encounter (Signed)
Pt is available now for your to cb

## 2014-04-18 NOTE — Telephone Encounter (Signed)
Left a message for the pt to return my call.  

## 2014-04-19 NOTE — Telephone Encounter (Signed)
Left a message for the pt to return my call.  Will now close the encounter.  Unable to reach the pt.

## 2014-09-14 ENCOUNTER — Other Ambulatory Visit: Payer: Self-pay

## 2014-10-11 ENCOUNTER — Ambulatory Visit: Payer: BC Managed Care – PPO | Admitting: Internal Medicine

## 2014-11-20 ENCOUNTER — Encounter: Payer: Self-pay | Admitting: Internal Medicine

## 2015-02-20 ENCOUNTER — Encounter: Payer: Self-pay | Admitting: Internal Medicine

## 2015-02-20 ENCOUNTER — Ambulatory Visit (INDEPENDENT_AMBULATORY_CARE_PROVIDER_SITE_OTHER): Payer: PRIVATE HEALTH INSURANCE | Admitting: Internal Medicine

## 2015-02-20 VITALS — BP 116/72 | Temp 98.7°F | Ht 66.25 in | Wt 154.0 lb

## 2015-02-20 DIAGNOSIS — L700 Acne vulgaris: Secondary | ICD-10-CM

## 2015-02-20 DIAGNOSIS — Z79899 Other long term (current) drug therapy: Secondary | ICD-10-CM

## 2015-02-20 DIAGNOSIS — F909 Attention-deficit hyperactivity disorder, unspecified type: Secondary | ICD-10-CM | POA: Diagnosis not present

## 2015-02-20 DIAGNOSIS — F988 Other specified behavioral and emotional disorders with onset usually occurring in childhood and adolescence: Secondary | ICD-10-CM

## 2015-02-20 MED ORDER — TRETINOIN 0.05 % EX CREA
TOPICAL_CREAM | Freq: Every day | CUTANEOUS | Status: DC
Start: 2015-02-20 — End: 2016-01-20

## 2015-02-20 MED ORDER — DOXYCYCLINE HYCLATE 100 MG PO TABS: 100.0000 mg | ORAL_TABLET | Freq: Two times a day (BID) | ORAL | Status: DC

## 2015-02-20 MED ORDER — AMPHETAMINE-DEXTROAMPHET ER 20 MG PO CP24: 20.0000 mg | ORAL_CAPSULE | Freq: Every day | ORAL | Status: DC

## 2015-02-20 NOTE — Patient Instructions (Addendum)
Trial of 20 mg adderall x r .   add benzoyl peroxide in am can get creams otc   Retinoid in evening as tolerated . Antibiotic every day  Plan ROV in 2 months for med and acne check  consider adding OC Ps again   Tretinoin skin cream (Acne) What is this medicine? TRETINOIN (TRET i noe in) is a naturally occurring form of vitamin A. It is used on the skin to treat mild to moderate acne. This medicine may be used for other purposes; ask your health care provider or pharmacist if you have questions. COMMON BRAND NAME(S): [place], AVITA, Refissa, Retin-A, Tretin-X What should I tell my health care provider before I take this medicine? They need to know if you have any of these conditions: -eczema -excessive sensitivity to the sun -sunburn -an unusual or allergic reaction to tretinoin, vitamin A, other medicines, foods, dyes, or preservatives -pregnant or trying to get pregnant -breast-feeding How should I use this medicine? This medicine is for external use only. Do not take by mouth. Follow the directions on the prescription label. Gently wash your face with a mild, non-medicated soap before use. Pat the skin dry. Wait 20 to 30 minutes for your skin to dry before use in order to minimize the possibility of skin irritation. Apply enough medicine to cover the affected area and rub in gently. Avoid applying this medicine to your eyes, ears, nostrils, angles of the nose, and mouth. Do not use more often than your doctor or health care professional has recommended. Using too much of this medicine may irritate or increase the irritation of your skin, and will not give faster or better results. Talk to your pediatrician regarding the use of this medicine in children. While this drug may be prescribed for children as young as 50 years of age for selected conditions, precautions do apply. Overdosage: If you think you have taken too much of this medicine contact a poison control center or emergency room  at once. NOTE: This medicine is only for you. Do not share this medicine with others. What if I miss a dose? If you miss a dose, skip that dose and continue with your regular schedule. Do not use extra doses, or use for a longer period of time than directed by your doctor or health care professional. What may interact with this medicine? -medicines or other preparations that may dry your skin such as benzoyl peroxide or salicylic acid -medicines that increase your sensitivity to sunlight such as tetracycline or sulfa drugs This list may not describe all possible interactions. Give your health care provider a list of all the medicines, herbs, non-prescription drugs, or dietary supplements you use. Also tell them if you smoke, drink alcohol, or use illegal drugs. Some items may interact with your medicine. What should I watch for while using this medicine? Your acne may get worse initially and should then start to improve. It may take 2 to 12 weeks before you see the full effect. Do not wash your face more than 2 or 3 times a day, unless directed by your doctor or health care professional. Do not use the following products on the same areas that you are treating with this medicine, unless otherwise directed by your doctor or health care professional: other topical agents with a strong skin drying effect such as products with a high alcohol content, astringents, spices, the peel of lime or other citrus, medicated soaps or shampoos, permanent wave solutions, electrolysis, hair removers or  waxes, or any other preparations or processes that might dry or irritate your skin. This medicine can make you more sensitive to the sun. Keep out of the sun. If you cannot avoid being in the sun, wear protective clothing and use sunscreen. Do not use sun lamps or tanning beds/booths. Avoid cold weather and wind as much as possible, and use clothing to protect you from the weather. Skin treated with this medicine may dry out  or get wind burned more easily. What side effects may I notice from receiving this medicine? Side effects that you should report to your doctor or health care professional as soon as possible: -darkening or lightening of the treated areas -severe burning, itching, crusting, or swelling of the treated areas Side effects that usually do not require medical attention (report to your doctor or health care professional if they continue or are bothersome): -increased sensitivity to the sun -itching -mild stinging -red, inflamed, and irritated skin, the skin may peel after a few days This list may not describe all possible side effects. Call your doctor for medical advice about side effects. You may report side effects to FDA at 1-800-FDA-1088. Where should I keep my medicine? Keep out of the reach of children. Store below 27 degrees C (80 degrees F). Do not freeze. Protect from light. Throw away any unused medicine after the expiration date. NOTE: This sheet is a summary. It may not cover all possible information. If you have questions about this medicine, talk to your doctor, pharmacist, or health care provider.  2015, [employer]vier/[employer] Standard. (2008-08-01 17:38:22)

## 2015-02-20 NOTE — Progress Notes (Signed)
Pre visit review using our clinic review tool, if applicable. No additional management support is needed unless otherwise documented below in the visit note.  Chief Complaint  Patient presents with  . Follow-up    adhd med    . Acne  . ADHD    HPI: Barbara Cuevas 29 y.o. with add on medication overdue for check . Insurance lapse for a short while  Was out of meds and had  No significant side effects such as major sleep issues and mood changes, chest pain, shortness of breath, headaches , GI or significant weight loss. 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 otc no meds  mostly face   Exercise : will runs. Tobacco neg etoh 1-2 per week.  Neg RD.   7-8 hours    ROS: See pertinent positives and negatives per HPI.  Past Medical History  Diagnosis Date  . Hx of varicella     Family History  Problem Relation Age of Onset  . Heart disease Father 43    Open-heart surgery  . Diabetes type II      Parent,Grandparent  . Arthritis      Parent  . ADD / ADHD Brother     History   Social History  . Marital Status: Single    Spouse Name: N/A  . Number of Children: N/A  . Years of Education: N/A   Social History Main Topics  . Smoking status: Never Smoker   . Smokeless tobacco: Not on file  . Alcohol Use: Yes     Comment: socially  . Drug Use: No  . Sexual Activity: Not on file   Other Topics Concern  . None   Social History Narrative   Currently working  [employer] 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    Household of 1   Lives and a residence attached to her family's house household of five   Regular sleep  8 hours    neg ets firearms.    Outpatient Encounter Prescriptions as of 02/20/2015  Medication Sig   . [DISCONTINUED] amphetamine-dextroamphetamine (ADDERALL XR) 15 MG 24 hr capsule Take 1 capsule (15 mg total) by mouth every morning.  . [DISCONTINUED] amphetamine-dextroamphetamine (ADDERALL XR) 15 MG 24 hr capsule Take 1 capsule (15 mg total) by mouth every morning.  . [DISCONTINUED] amphetamine-dextroamphetamine (ADDERALL XR) 15 MG 24 hr capsule Take 1 capsule (15 mg total) by mouth every morning.  Marland Kitchen amphetamine-dextroamphetamine (ADDERALL XR) 20 MG 24 hr capsule Take 1 capsule (20 mg total) by mouth daily.  Marland Kitchen doxycycline (VIBRA-TABS) 100 MG tablet Take 1 tablet (100 mg total) by mouth 2 (two) times daily. For 2 weeks then 1 po qd or as directed  . tretinoin (RETIN-A) 0.05 % cream Apply topically at bedtime. For acne  . [DISCONTINUED] amphetamine-dextroamphetamine (ADDERALL XR) 20 MG 24 hr capsule Take 1 capsule (20 mg total) by mouth daily.    EXAM:  BP 116/72 mmHg  Temp(Src) 98.7 F (37.1 C) (Oral)  Ht 5' 6.25" (1.683 m)  Wt 154 lb (69.854 kg)  BMI 24.66 kg/m2  Body mass index is 24.66 kg/(m^2).  GENERAL: vitals reviewed and listed above, alert, oriented, appears well hydrated and in no acute distress HEENT: atraumatic, conjunctiva  clear, no obvious abnormalities on inspection of external nose and ears OP : no lesion edema or exudate  NECK: no obvious masses on inspection palpation  LUNGS: clear to auscultation bilaterally, no wheezes, rales or rhonchi, good air movement CV: HRRR, no clubbing cyanosis or  peripheral edema nl cap refill  Skin face with moderate inflammatory acne cheeks and chin some scarring  Min on chest body hair seems nl  MS: moves all extremities without noticeable focal  abnormality PSYCH: pleasant and cooperative, no obvious depression or anxiety  ASSESSMENT AND PLAN:  Discussed the following assessment and plan:  ADD (attention deficit disorder) probable   Medication management  Acne vulgaris - mod inflammatory  triple therapy pt wants to wait on   ocps trial again fu in 2 months or as needed cnosdier derm input.  Adequate response to med . Side effect ;decrease appetite ,acceptable .Continue at same dose . ROV in 6 months for med check.  Or cpx  Acne flare  Instructions   Expectant management.  -Patient advised to return or notify health care team  if symptoms worsen ,persist or new concerns arise.  Patient Instructions  Trial of 20 mg adderall x r .   add benzoyl peroxide in am can get creams otc   Retinoid in evening as tolerated . Antibiotic every day  Plan ROV in 2 months for med and acne check  consider adding OC Ps again   Tretinoin skin cream (Acne) What is this medicine? TRETINOIN (TRET i noe in) is a naturally occurring form of vitamin A. It is used on the skin to treat mild to moderate acne. This medicine may be used for other purposes; ask your health care provider or pharmacist if you have questions. COMMON BRAND NAME(S): [place], AVITA, Refissa, Retin-A, Tretin-X What should I tell my health care provider before I take this medicine? They need to know if you have any of these conditions: -eczema -excessive sensitivity to the sun -sunburn -an unusual or allergic reaction to tretinoin, vitamin A, other medicines, foods, dyes, or preservatives -pregnant or trying to get pregnant -breast-feeding How should I use this medicine? This medicine is for external use only. Do not take by mouth. Follow the directions on the prescription label. Gently wash your face with a mild, non-medicated soap before use. Pat the skin dry. Wait 20 to 30 minutes for your skin to dry before use in order to minimize the possibility of skin irritation. Apply enough medicine to cover the affected area and rub in gently. Avoid applying this medicine to your eyes, ears, nostrils, angles of the nose, and mouth. Do not use more often than your doctor or health care professional has recommended. Using too much of this medicine may irritate or increase  the irritation of your skin, and will not give faster or better results. Talk to your pediatrician regarding the use of this medicine in children. While this drug may be prescribed for children as young as 26 years of age for selected conditions, precautions do apply. Overdosage: If you think you have taken too much of this medicine contact a poison control center or emergency room at once. NOTE: This medicine is only for you. Do not share this medicine with others. What if I miss a dose? If you miss a dose, skip that dose and continue with your regular schedule. Do not use extra  doses, or use for a longer period of time than directed by your doctor or health care professional. What may interact with this medicine? -medicines or other preparations that may dry your skin such as benzoyl peroxide or salicylic acid -medicines that increase your sensitivity to sunlight such as tetracycline or sulfa drugs This list may not describe all possible interactions. Give your health care provider a list of all the medicines, herbs, non-prescription drugs, or dietary supplements you use. Also tell them if you smoke, drink alcohol, or use illegal drugs. Some items may interact with your medicine. What should I watch for while using this medicine? Your acne may get worse initially and should then start to improve. It may take 2 to 12 weeks before you see the full effect. Do not wash your face more than 2 or 3 times a day, unless directed by your doctor or health care professional. Do not use the following products on the same areas that you are treating with this medicine, unless otherwise directed by your doctor or health care professional: other topical agents with a strong skin drying effect such as products with a high alcohol content, astringents, spices, the peel of lime or other citrus, medicated soaps or shampoos, permanent wave solutions, electrolysis, hair removers or waxes, or any other preparations or  processes that might dry or irritate your skin. This medicine can make you more sensitive to the sun. Keep out of the sun. If you cannot avoid being in the sun, wear protective clothing and use sunscreen. Do not use sun lamps or tanning beds/booths. Avoid cold weather and wind as much as possible, and use clothing to protect you from the weather. Skin treated with this medicine may dry out or get wind burned more easily. What side effects may I notice from receiving this medicine? Side effects that you should report to your doctor or health care professional as soon as possible: -darkening or lightening of the treated areas -severe burning, itching, crusting, or swelling of the treated areas Side effects that usually do not require medical attention (report to your doctor or health care professional if they continue or are bothersome): -increased sensitivity to the sun -itching -mild stinging -red, inflamed, and irritated skin, the skin may peel after a few days This list may not describe all possible side effects. Call your doctor for medical advice about side effects. You may report side effects to FDA at 1-800-FDA-1088. Where should I keep my medicine? Keep out of the reach of children. Store below 27 degrees C (80 degrees F). Do not freeze. Protect from light. Throw away any unused medicine after the expiration date. NOTE: This sheet is a summary. It may not cover all possible information. If you have questions about this medicine, talk to your doctor, pharmacist, or health care provider.  2015, Elsevier/Gold Standard. (2008-08-01 17:38:22)      Neta MendsWanda K. Makel Mcmann M.D.

## 2015-04-12 ENCOUNTER — Encounter: Payer: PRIVATE HEALTH INSURANCE | Admitting: Internal Medicine

## 2015-04-12 DIAGNOSIS — Z0289 Encounter for other administrative examinations: Secondary | ICD-10-CM

## 2015-04-12 NOTE — Progress Notes (Signed)
Document opened and reviewed for OV but appt  NS same day . See note got wrong date  Coming in thesi week

## 2015-04-17 ENCOUNTER — Telehealth: Payer: Self-pay | Admitting: Internal Medicine

## 2015-04-17 ENCOUNTER — Encounter: Payer: Self-pay | Admitting: Internal Medicine

## 2015-04-17 NOTE — Telephone Encounter (Signed)
Pt thought he appt was this Friday, but it was last Friday and she was a no-show. This is a 2 mo fup and pt would like to come in this Friday. Only same day appt left. Ok to schedule?

## 2015-04-17 NOTE — Telephone Encounter (Signed)
Please advise if okay to use an SDA slot.  That's all you have left on Friday

## 2015-04-18 NOTE — Telephone Encounter (Signed)
Ok to have her come in on sda slot

## 2015-04-19 ENCOUNTER — Ambulatory Visit (INDEPENDENT_AMBULATORY_CARE_PROVIDER_SITE_OTHER): Payer: PRIVATE HEALTH INSURANCE | Admitting: Internal Medicine

## 2015-04-19 ENCOUNTER — Encounter: Payer: Self-pay | Admitting: Internal Medicine

## 2015-04-19 VITALS — BP 110/64 | Temp 98.3°F | Ht 65.75 in | Wt 147.8 lb

## 2015-04-19 DIAGNOSIS — L700 Acne vulgaris: Secondary | ICD-10-CM

## 2015-04-19 DIAGNOSIS — F909 Attention-deficit hyperactivity disorder, unspecified type: Secondary | ICD-10-CM

## 2015-04-19 DIAGNOSIS — Z79899 Other long term (current) drug therapy: Secondary | ICD-10-CM

## 2015-04-19 DIAGNOSIS — F988 Other specified behavioral and emotional disorders with onset usually occurring in childhood and adolescence: Secondary | ICD-10-CM

## 2015-04-19 MED ORDER — AMPHETAMINE-DEXTROAMPHET ER 20 MG PO CP24: 20.0000 mg | ORAL_CAPSULE | Freq: Every day | ORAL | Status: DC

## 2015-04-19 MED ORDER — AMPHETAMINE-DEXTROAMPHET ER 20 MG PO CP24: 20.0000 mg | ORAL_CAPSULE | ORAL | Status: DC

## 2015-04-19 MED ORDER — NORETHINDRONE ACET-ETHINYL EST 1.5-30 MG-MCG PO TABS: 1.0000 | ORAL_TABLET | Freq: Every day | ORAL | Status: DC

## 2015-04-19 NOTE — Telephone Encounter (Signed)
done

## 2015-04-19 NOTE — Patient Instructions (Signed)
Work on change control of   Study setsing environment . Get enough tlseep and some exercise . Study at your best time of day usually ams.  Consider seeing  Counseling for test anxiety if needed.  Stay on the retina  Add  Hormonal therapy again .

## 2015-04-19 NOTE — Progress Notes (Signed)
Pre visit review using our clinic review tool, if applicable. No additional management support is needed unless otherwise documented below in the visit note.   Chief Complaint  Patient presents with  . Follow-up    HPI: Barbara Cuevas 29 y.o. comes in  Fu adhd  Med and acne  Acne Doxy not that helpful just ran out  But using the retina ok   No need for contraception willing to try ocps for acne management. No xh clots  Tobacco or  classic migraine.   adhd adderall xr helps but still having time  wot on line course concentration  Has a good bit of anxiety about the large test taking .  otherwise ok   Bro had add  meds didn t help  Much  ROS: See pertinent positives and negatives per HPI.no cv pulm sx .   Past Medical History  Diagnosis Date  . Hx of varicella     Family History  Problem Relation Age of Onset  . Heart disease Father 455    Open-heart surgery  . Diabetes type II      Parent,Grandparent  . Arthritis      Parent  . ADD / ADHD Brother     History   Social History  . Marital Status: Single    Spouse Name: N/A  . Number of Children: N/A  . Years of Education: N/A   Social History Main Topics  . Smoking status: Never Smoker   . Smokeless tobacco: Not on file  . Alcohol Use: Yes     Comment: socially  . Drug Use: No  . Sexual Activity: Not on file   Other Topics Concern  . 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    Household of 1   Lives and a residence attached to her family's house household of five   Regular sleep  8 hours    neg ets firearms.    Outpatient Prescriptions Prior to Visit  Medication Sig Dispense Refill  . tretinoin (RETIN-A) 0.05 % cream Apply topically at bedtime. For acne 45 g 1  . amphetamine-dextroamphetamine (ADDERALL XR) 20 MG 24 hr capsule Take 1 capsule (20 mg total) by mouth daily. 30 capsule 0  . doxycycline  (VIBRA-TABS) 100 MG tablet Take 1 tablet (100 mg total) by mouth 2 (two) times daily. For 2 weeks then 1 po qd or as directed 60 tablet 1   No facility-administered medications prior to visit.     EXAM:  BP 110/64 mmHg  Temp(Src) 98.3 F (36.8 C) (Oral)  Ht 5' 5.75" (1.67 m)  Wt 147 lb 12.8 oz (67.042 kg)  BMI 24.04 kg/m2  Body mass index is 24.04 kg/(m^2).  GENERAL: vitals reviewed and listed above, alert, oriented, appears well hydrated and in no acute distress HEENT: atraumatic, conjunctiva  clear, no obvious abnormalities on inspection of external nose and ears NECK: no obvious masses on inspection palpation  LUNGS: clear to auscultation bilaterally, no wheezes, rales or rhonchi, good air movement CV: HRRR, no clubbing cyanosis or  peripheral edema nl cap refill  MS: moves all extremities without noticeable focal  Abnormality Skin: normal capillary refill ,turgor , color: No acute rashes ,petechiae or bruising acne face muted  No nodular lesion 10 +  No hair changes  PSYCH: pleasant and cooperative, no obvious depression or anxiety  ASSESSMENT AND PLAN:  Discussed the following assessment and plan:  ADD (attention deficit disorder) probable  - response to med  but will rearrange environment to help  consider counseling about testsanxiety.  Medication management  Acne vulgaris - min response to doxy ok with retina   add ocps  as   and rov in 3 months or contact about status for refill   Can skip appt if doing well and adivsed of such otherwise wellness ov med check in 6 months  optinos disc remain on same dose at this time -Patient advised to return or notify health care team  if symptoms worsen ,persist or new concerns arise.  Patient Instructions  Work on change control of   Study setsing environment . Get enough tlseep and some exercise . Study at your best time of day usually ams.  Consider seeing  Counseling for test anxiety if needed.  Stay on the retina  Add   Hormonal therapy again .     Neta MendsWanda K. Panosh M.D.

## 2015-05-05 IMAGING — US US TRANSVAGINAL NON-OB
1 series · 14 of 25 positions shown · non-contrast
Comparison: None

CLINICAL DATA: Left pelvic pain.

EXAM:
TRANSABDOMINAL AND TRANSVAGINAL ULTRASOUND OF PELVIS
TECHNIQUE: Both transabdominal and transvaginal ultrasound examinations of the
pelvis were performed. Transabdominal technique was performed for
global imaging of the pelvis including uterus, ovaries, adnexal
regions, and pelvic cul-de-sac. It was necessary to proceed with
endovaginal exam following the transabdominal exam to visualize the
ovaries and endometrium.

[Series 1: us transvaginal non-ob · 0.22mm/px · 14 of 37 slices shown]
[im 1/37]
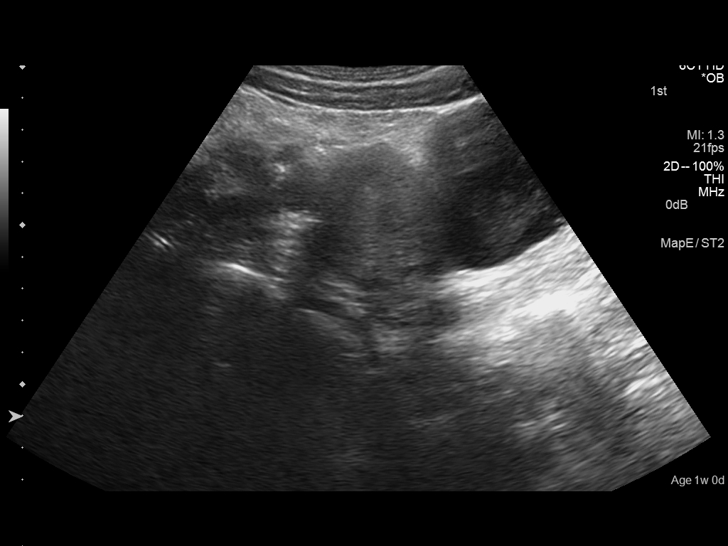
[im 4/37]
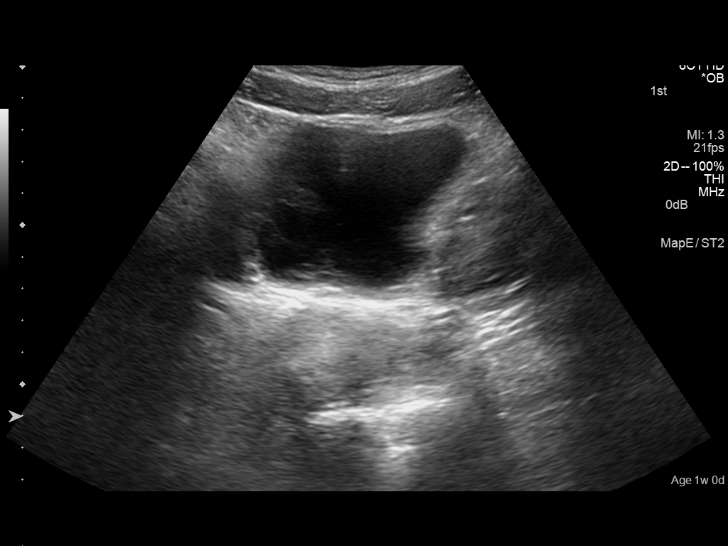
[im 7/37]
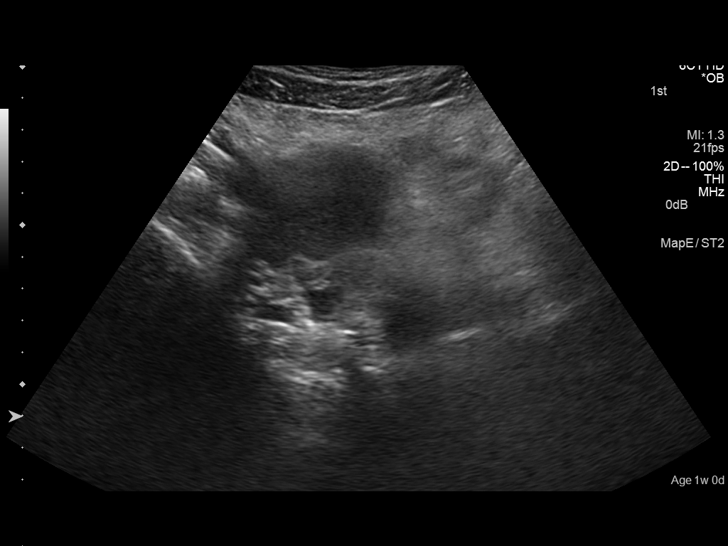
[im 10/37]
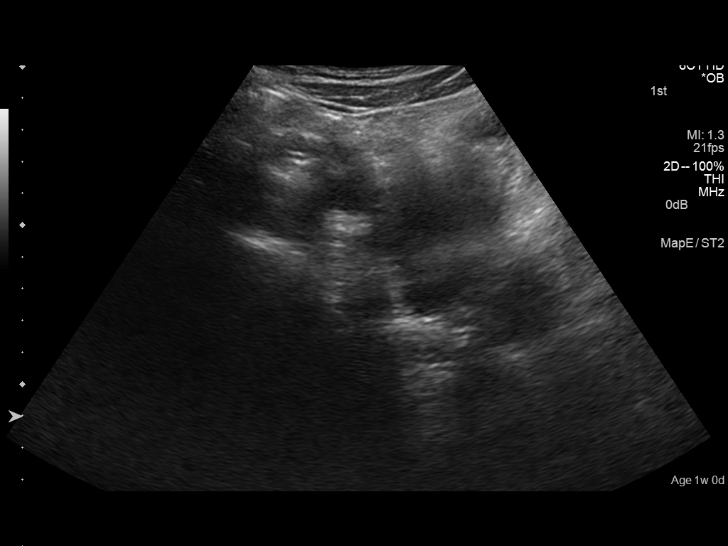
[im 13/37]
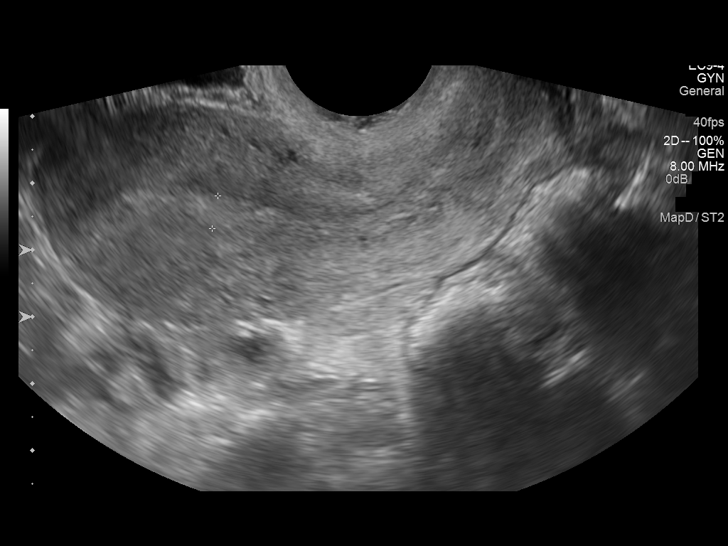
[im 14/37]
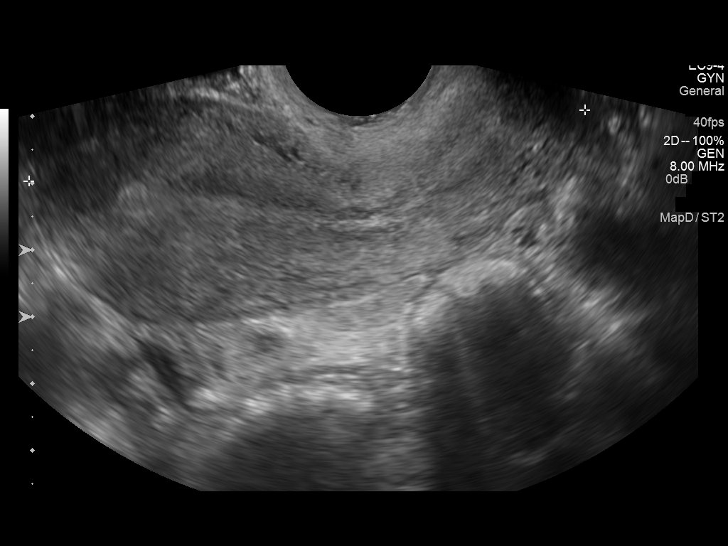
[im 17/37]
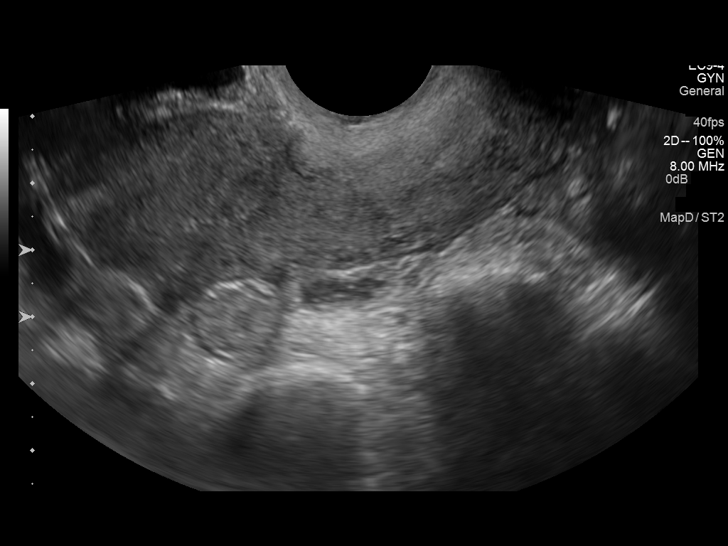
[im 20/37]
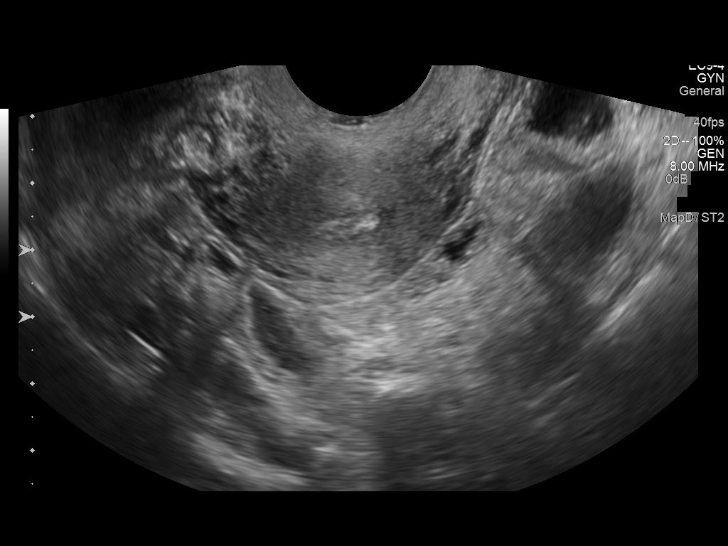
[im 23/37]
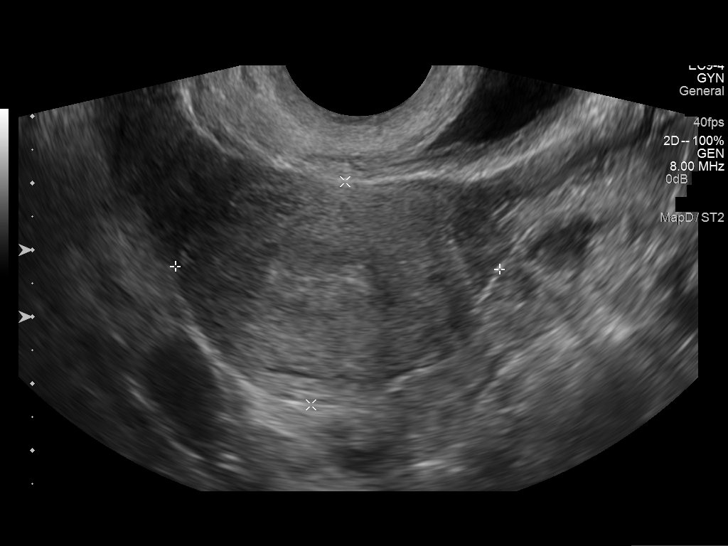
[im 25/37]
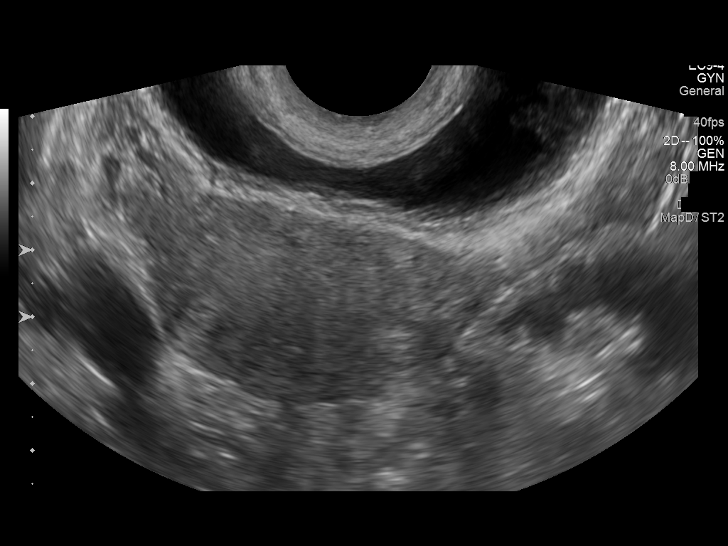
[im 28/37]
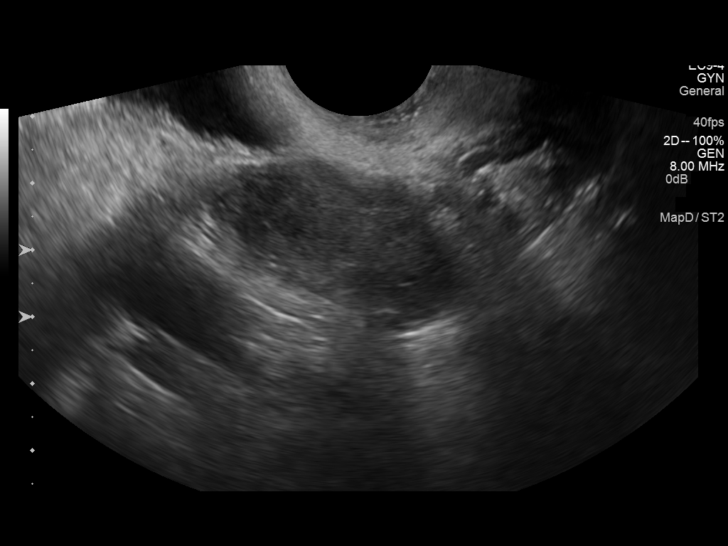
[im 31/37]
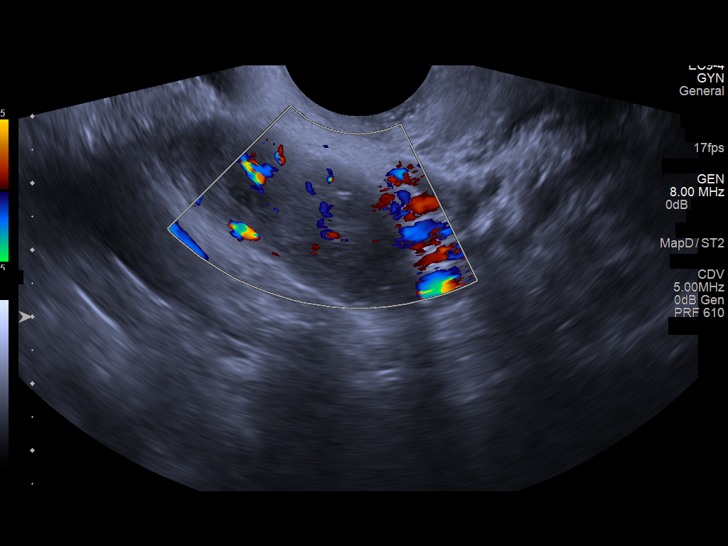
[im 34/37]
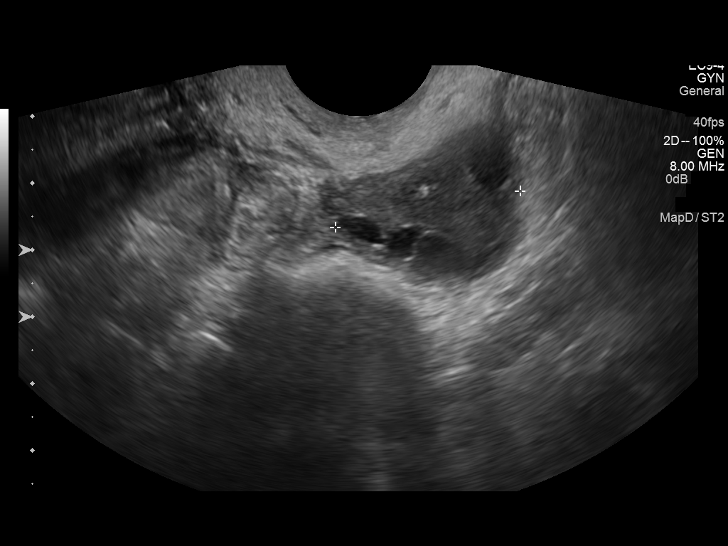
[im 37/37]
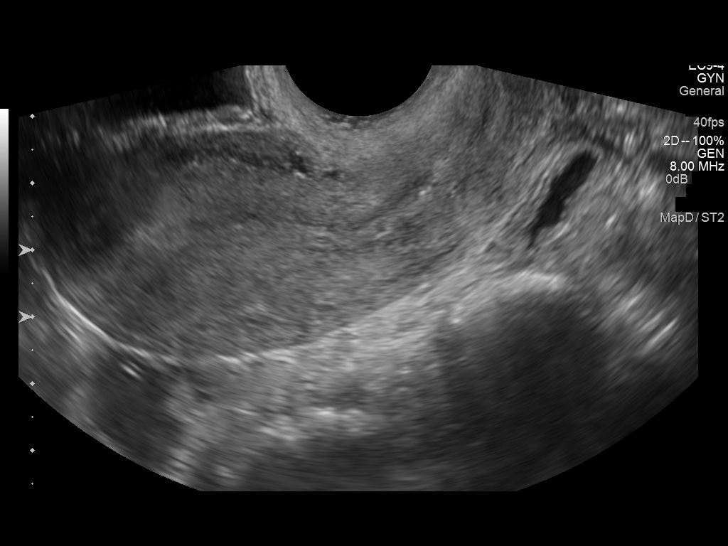

[14 of 25 positions shown; findings below may reference images not displayed]

FINDINGS: Uterus

Measurements: 8.4 x 3.4 x 4.8 cm, anteverted. No fibroids or other
mass visualized.

Endometrium

Thickness: 5 mm.  No focal abnormality visualized.

Right ovary

Measurements: 3.9 x 2.1 x 1.8 cm. Normal appearance/no adnexal mass.

Left ovary

Measurements: 3.5 x 2.3 x 2.8 cm. Normal appearance/no adnexal mass.

Other findings

No free pelvic fluid collections. Incidentally images obtained of
the bladder demonstrate filling defects layering in the bladder
consistent with debris. This could indicate hemorrhage or infection.
IMPRESSION: Normal ultrasound appearance of the uterus and ovaries. Debris in
the urinary bladder suggesting infection or hemorrhage.

## 2015-07-19 ENCOUNTER — Encounter: Payer: Self-pay | Admitting: Internal Medicine

## 2015-07-19 ENCOUNTER — Ambulatory Visit (INDEPENDENT_AMBULATORY_CARE_PROVIDER_SITE_OTHER): Payer: PRIVATE HEALTH INSURANCE | Admitting: Internal Medicine

## 2015-07-19 VITALS — BP 108/74 | Temp 97.7°F | Wt 144.0 lb

## 2015-07-19 DIAGNOSIS — Z79899 Other long term (current) drug therapy: Secondary | ICD-10-CM | POA: Diagnosis not present

## 2015-07-19 DIAGNOSIS — Z0000 Encounter for general adult medical examination without abnormal findings: Secondary | ICD-10-CM | POA: Diagnosis not present

## 2015-07-19 DIAGNOSIS — L700 Acne vulgaris: Secondary | ICD-10-CM | POA: Diagnosis not present

## 2015-07-19 DIAGNOSIS — F909 Attention-deficit hyperactivity disorder, unspecified type: Secondary | ICD-10-CM | POA: Diagnosis not present

## 2015-07-19 DIAGNOSIS — R739 Hyperglycemia, unspecified: Secondary | ICD-10-CM | POA: Diagnosis not present

## 2015-07-19 DIAGNOSIS — F988 Other specified behavioral and emotional disorders with onset usually occurring in childhood and adolescence: Secondary | ICD-10-CM

## 2015-07-19 DIAGNOSIS — Z3041 Encounter for surveillance of contraceptive pills: Secondary | ICD-10-CM

## 2015-07-19 LAB — HEPATIC FUNCTION PANEL
ALT: 18 U/L (ref 0–35)
AST: 18 U/L (ref 0–37)
Albumin: 4.6 g/dL (ref 3.5–5.2)
Alkaline Phosphatase: 66 U/L (ref 39–117)
Bilirubin, Direct: 0.1 mg/dL (ref 0.0–0.3)
Total Bilirubin: 0.6 mg/dL (ref 0.2–1.2)
Total Protein: 7.4 g/dL (ref 6.0–8.3)

## 2015-07-19 LAB — BASIC METABOLIC PANEL
BUN: 13 mg/dL (ref 6–23)
CO2: 29 mEq/L (ref 19–32)
Calcium: 9.4 mg/dL (ref 8.4–10.5)
Chloride: 105 mEq/L (ref 96–112)
Creatinine, Ser: 0.74 mg/dL (ref 0.40–1.20)
GFR: 100.06 mL/min (ref >60.00)
GLUCOSE: 81 mg/dL (ref 70–99)
POTASSIUM: 4.6 meq/L (ref 3.5–5.1)
Sodium: 141 mEq/L (ref 135–145)

## 2015-07-19 LAB — CBC WITH DIFFERENTIAL/PLATELET
Basophils Absolute: 0 10*3/uL (ref 0.0–0.1)
Basophils Relative: 0.4 % (ref 0.0–3.0)
EOS PCT: 1.1 % (ref 0.0–5.0)
Eosinophils Absolute: 0.1 10*3/uL (ref 0.0–0.7)
HCT: 40.9 % (ref 36.0–46.0)
Hemoglobin: 13.9 g/dL (ref 12.0–15.0)
LYMPHS ABS: 1.9 10*3/uL (ref 0.7–4.0)
Lymphocytes Relative: 23.8 % (ref 12.0–46.0)
MCHC: 34 g/dL (ref 30.0–36.0)
MCV: 92.6 fl (ref 78.0–100.0)
Monocytes Absolute: 0.4 10*3/uL (ref 0.1–1.0)
Monocytes Relative: 4.8 % (ref 3.0–12.0)
Neutro Abs: 5.7 10*3/uL (ref 1.4–7.7)
Neutrophils Relative %: 69.9 % (ref 43.0–77.0)
Platelets: 221 10*3/uL (ref 150.0–400.0)
RBC: 4.42 Mil/uL (ref 3.87–5.11)
RDW: 12.3 % (ref 11.5–15.5)
WBC: 8.1 10*3/uL (ref 4.0–10.5)

## 2015-07-19 LAB — LIPID PANEL
Cholesterol: 142 mg/dL (ref 0–200)
HDL: 52.2 mg/dL (ref >39.00)
LDL Cholesterol: 82 mg/dL (ref 0–99)
NonHDL: 90.08
Total CHOL/HDL Ratio: 3
Triglycerides: 41 mg/dL (ref 0.0–149.0)
VLDL: 8.2 mg/dL (ref 0.0–40.0)

## 2015-07-19 LAB — HEMOGLOBIN A1C: Hgb A1c MFr Bld: 5.2 % (ref 4.6–6.5)

## 2015-07-19 LAB — TSH: TSH: 0.87 u[IU]/mL (ref 0.35–4.50)

## 2015-07-19 MED ORDER — AMPHETAMINE-DEXTROAMPHET ER 20 MG PO CP24: 20.0000 mg | ORAL_CAPSULE | ORAL | Status: DC

## 2015-07-19 MED ORDER — NORETHINDRONE ACET-ETHINYL EST 1.5-30 MG-MCG PO TABS: 1.0000 | ORAL_TABLET | Freq: Every day | ORAL | Status: DC

## 2015-07-19 MED ORDER — AMPHETAMINE-DEXTROAMPHET ER 20 MG PO CP24: 20.0000 mg | ORAL_CAPSULE | Freq: Every day | ORAL | Status: DC

## 2015-07-19 NOTE — Patient Instructions (Addendum)
Get [place] form as needed  Continue on same meds for now.  Healthy lifestyle includes : At least 150 minutes of exercise weeks  , weight at healthy levels, which is usually   BMI 19-25. Avoid trans fats and processed foods;  Increase fresh fruits and veges to 5 servings per day. And avoid sweet beverages including tea and juice. Mediterranean diet with olive oil and nuts have been noted to be heart and brain healthy . Avoid tobacco products . Limit  alcohol to  7 per week for women and 14 servings for men.  Get adequate sleep . Wear seat belts . Don't text and drive .   rov in 6 months  Or as needed

## 2015-07-19 NOTE — Progress Notes (Signed)
Pre visit review using our clinic review tool, if applicable. No additional management support is needed unless otherwise documented below in the visit note.   Chief Complaint  Patient presents with  . Follow-up    meds   . Annual Exam  . ADHD    HPI: Patient  Barbara Cuevas  29 y.o. comes in today for Preventive Health Care visit  Needs for work and fu of medication    adhd add:  Medication management .  And beginning ocps for acne    Not that much help so far on acne  Hard to tell.  Had to change pharmacy  Only took 2 months worth needs refill  ? Makes her hngry?  Since last visit : Taking medication:  Adderall.  Most days.  Work .  Took test again yesterday.  School/ grades : just took test again  With med  Feels ok may have passed will find out soon  Sleep :  About 7-8  Diet :ok  Mood : ok  Medication assessment :No significant side effects such as major sleep issues and mood changes, chest pain, shortness of breath, headaches , GI or significant weight loss.    Health Maintenance  Topic Date Due  . INFLUENZA VACCINE  07/01/2015  . TETANUS/TDAP  03/24/2021  . HIV Screening  Completed   Health Maintenance Review LIFESTYLE:  Exercise:  Active  Tobacco/ETS:no Alcohol: per day ocas Sugar beverages:no Sleep:yes Drug use: no  ROS:  GEN/ HEENT: No fever, significant weight changes sweats headaches vision problems hearing changes, CV/ PULM; No chest pain shortness of breath cough, syncope,edema  change in exercise tolerance. GI /GU: No adominal pain, vomiting, change in bowel habits. No blood in the stool. No significant GU symptoms. SKIN/HEME: ,no acute skin rashes suspicious lesions or bleeding. No lymphadenopathy, nodules, masses.  NEURO/ PSYCH:  No neurologic signs such as weakness numbness. No depression anxiety. IMM/ Allergy: No unusual infections.  Allergy .   REST of 12 system review negative except as per HPI   Past Medical History  Diagnosis Date  . Hx of  varicella     No past surgical history on file.  Family History  Problem Relation Age of Onset  . Heart disease Father 64    Open-heart surgery  . Diabetes type II      Parent,Grandparent  . Arthritis      Parent  . ADD / ADHD Brother     Social History   Social History  . Marital Status: Single    Spouse Name: N/A  . Number of Children: N/A  . Years of Education: N/A   Social History Main Topics  . Smoking status: Never Smoker   . Smokeless tobacco: None  . Alcohol Use: Yes     Comment: socially  . Drug Use: No  . Sexual Activity: Not Asked   Other Topics Concern  . None   Social History Narrative   Currently working  [employer] 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    Household of 1   Lives and a residence attached to her family's house household of five   Regular sleep  8 hours    neg ets firearms.    Outpatient Prescriptions Prior to Visit  Medication Sig Dispense Refill  . tretinoin (RETIN-A) 0.05 % cream Apply topically at bedtime. For acne 45 g 1  . amphetamine-dextroamphetamine (ADDERALL XR) 20 MG 24 hr capsule  Take 1 capsule (20 mg total) by mouth daily. 30 capsule 0  . amphetamine-dextroamphetamine (ADDERALL XR) 20 MG 24 hr capsule Take 1 capsule (20 mg total) by mouth every morning. 30 capsule 0  . amphetamine-dextroamphetamine (ADDERALL XR) 20 MG 24 hr capsule Take 1 capsule (20 mg total) by mouth every morning. 30 capsule 0  . Norethindrone Acetate-Ethinyl Estradiol (JUNEL,LOESTRIN,MICROGESTIN) 1.5-30 MG-MCG tablet Take 1 tablet by mouth daily. (Patient not taking: Reported on 07/19/2015) 1 Package 4   No facility-administered medications prior to visit.     EXAM:  BP 108/74 mmHg  Temp(Src) 97.7 F (36.5 C) (Oral)  Wt 144 lb (65.318 kg)  Body mass index is 23.42 kg/(m^2).  Physical Exam: Vital signs reviewed WUJ:WJXB is a well-developed well-nourished alert cooperative    who appearsr stated age in no  acute distress.  HEENT: normocephalic atraumatic , Eyes: PERRL EOM's full, conjunctiva clear, Nares: paten,t no deformity discharge or tenderness., Ears: no deformity EAC's clear TMs with normal landmarks. Mouth: clear OP, no lesions, edema.  Moist mucous membranes. Dentition in adequate repair. NECK: supple without masses, thyromegaly or bruits. CHEST/PULM:  Clear to auscultation and percussion breath sounds equal no wheeze , rales or rhonchi. No chest wall deformities or tenderness.Breast: normal by inspection . No dimpling, discharge, masses, tenderness or discharge . CV: PMI is nondisplaced, S1 S2 no gallops, murmurs, rubs. Peripheral pulses are full without delay.No JVD .  ABDOMEN: Bowel sounds normal nontender  No guard or rebound, no hepato splenomegal no CVA tenderness.   Extremtities:  No clubbing cyanosis or edema, no acute joint swelling or redness no focal atrophy NEURO:  Oriented x3, cranial nerves 3-12 appear to be intact, no obvious focal weakness,gait within normal limits no abnormal reflexes or asymmetrical SKIN: No acute rashes normal turgor, color, no bruising or petechiae.  Acne lower face  10 inflamm lesions  Has make up on  PSYCH: Oriented, good eye contact, no obvious depression anxiety, cognition and judgment appear normal. LN: no cervical axillary inguinal adenopathy    ASSESSMENT AND PLAN:  Discussed the following assessment and plan:  Visit for preventive health examination - reveiwed utd parameters   counsled health issues labs today( non fasting)  - Plan: Basic metabolic panel, CBC with Differential/Platelet, Hemoglobin A1c, Hepatic function panel, Lipid panel, TSH  ADD (attention deficit disorder) probable  - benefir more than risk  for patient  continue   Medication management - Plan: Basic metabolic panel, CBC with Differential/Platelet, Hemoglobin A1c, Hepatic function panel, Lipid panel, TSH  Acne vulgaris  Hyperglycemia - elevaetd when sick  fam hx  check  a1c  had granola bar this am  with water  - Plan: Basic metabolic panel, CBC with Differential/Platelet, Hemoglobin A1c, Hepatic function panel, Lipid panel, TSH  Oral contraceptive pill surveillance - ran out refill and monitor    Patient Care Team: Madelin Headings, MD as PCP - General (Internal Medicine) Patient Instructions  Get [place] form as needed  Continue on same meds for now.  Healthy lifestyle includes : At least 150 minutes of exercise weeks  , weight at healthy levels, which is usually   BMI 19-25. Avoid trans fats and processed foods;  Increase fresh fruits and veges to 5 servings per day. And avoid sweet beverages including tea and juice. Mediterranean diet with olive oil and nuts have been noted to be heart and brain healthy . Avoid tobacco products . Limit  alcohol to  7 per week for women and 14  servings for men.  Get adequate sleep . Wear seat belts . Don't text and drive .   rov in 6 months  Or as needed     Burna Mortimer K. Panosh M.D.  Lab Results  Component Value Date   WBC 8.1 07/19/2015   HGB 13.9 07/19/2015   HCT 40.9 07/19/2015   PLT 221.0 07/19/2015   GLUCOSE 81 07/19/2015   CHOL 142 07/19/2015   TRIG 41.0 07/19/2015   HDL 52.20 07/19/2015   LDLCALC 82 07/19/2015   ALT 18 07/19/2015   AST 18 07/19/2015   NA 141 07/19/2015   K 4.6 07/19/2015   CL 105 07/19/2015   CREATININE 0.74 07/19/2015   BUN 13 07/19/2015   CO2 29 07/19/2015   TSH 0.87 07/19/2015   HGBA1C 5.2 07/19/2015

## 2015-11-08 ENCOUNTER — Other Ambulatory Visit: Payer: Self-pay | Admitting: Internal Medicine

## 2015-11-11 MED ORDER — AMPHETAMINE-DEXTROAMPHET ER 20 MG PO CP24: 20.0000 mg | ORAL_CAPSULE | Freq: Every day | ORAL | Status: DC

## 2015-11-11 MED ORDER — AMPHETAMINE-DEXTROAMPHET ER 20 MG PO CP24: 20.0000 mg | ORAL_CAPSULE | ORAL | Status: DC

## 2015-11-11 NOTE — Addendum Note (Signed)
Addended by: Raj JanusADKINS, MISTY T on: 11/11/2015 08:03 AM   Modules accepted: Orders

## 2016-01-20 ENCOUNTER — Ambulatory Visit (INDEPENDENT_AMBULATORY_CARE_PROVIDER_SITE_OTHER): Payer: Commercial Managed Care - PPO | Admitting: Internal Medicine

## 2016-01-20 ENCOUNTER — Encounter: Payer: Self-pay | Admitting: Internal Medicine

## 2016-01-20 VITALS — BP 116/80 | Temp 98.1°F | Wt 144.8 lb

## 2016-01-20 DIAGNOSIS — Z79899 Other long term (current) drug therapy: Secondary | ICD-10-CM | POA: Diagnosis not present

## 2016-01-20 DIAGNOSIS — F909 Attention-deficit hyperactivity disorder, unspecified type: Secondary | ICD-10-CM

## 2016-01-20 DIAGNOSIS — F988 Other specified behavioral and emotional disorders with onset usually occurring in childhood and adolescence: Secondary | ICD-10-CM

## 2016-01-20 DIAGNOSIS — L700 Acne vulgaris: Secondary | ICD-10-CM

## 2016-01-20 MED ORDER — TRETINOIN 0.1 % EX CREA: TOPICAL_CREAM | Freq: Every day | CUTANEOUS | Status: DC

## 2016-01-20 MED ORDER — AMPHETAMINE-DEXTROAMPHET ER 20 MG PO CP24: 20.0000 mg | ORAL_CAPSULE | ORAL | Status: DC

## 2016-01-20 MED ORDER — NORETHINDRONE ACET-ETHINYL EST 1.5-30 MG-MCG PO TABS: 1.0000 | ORAL_TABLET | Freq: Every day | ORAL | Status: DC

## 2016-01-20 MED ORDER — AMPHETAMINE-DEXTROAMPHET ER 20 MG PO CP24: 20.0000 mg | ORAL_CAPSULE | Freq: Every day | ORAL | Status: DC

## 2016-01-20 NOTE — Progress Notes (Signed)
Pre visit review using our clinic review tool, if applicable. No additional management support is needed unless otherwise documented below in the visit note.  Chief Complaint  Patient presents with  . Follow-up    HPI: Barbara Cuevas 30 y.o. 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   Adderall xr   Work days  .   Class and studying.   No sig se  Didn't get ocp cost 40 $ under old insurance   Retin 00.01 not that helpful after 4 weeks so stopped  Periods about every other month  5 days   Sleep  Ok 7 hours .  Exercises runs  . TAD. ROS: See pertinent positives and negatives per HPI. No cp sob  Mood disturbance   Past Medical History  Diagnosis Date  . Hx of varicella     Family History  Problem Relation Age of Onset  . Heart disease Father 57    Open-heart surgery  . Diabetes type II      Parent,Grandparent  . Arthritis      Parent  . ADD / ADHD Brother     Social History   Social History  . Marital Status: Single    Spouse Name: N/A  . Number of Children: N/A  . Years of Education: N/A   Social History Main Topics  . Smoking status: Never Smoker   . Smokeless tobacco: None  . Alcohol Use: Yes     Comment: socially  . Drug Use: No  . Sexual Activity: Not Asked   Other Topics Concern  . None   Social History Narrative   Currently working  [employer] 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    Household of 1   Lives and a residence attached to her family's house household of five   Regular sleep  8 hours    neg ets firearms.    Outpatient Prescriptions Prior to Visit  Medication Sig Dispense Refill  . amphetamine-dextroamphetamine (ADDERALL XR) 20 MG 24 hr capsule Take 1 capsule (20 mg total) by mouth every morning. 30 capsule 0  . amphetamine-dextroamphetamine (ADDERALL XR) 20 MG 24 hr capsule Take 1 capsule (20 mg total) by mouth every morning. 30  capsule 0  . amphetamine-dextroamphetamine (ADDERALL XR) 20 MG 24 hr capsule Take 1 capsule (20 mg total) by mouth daily. 30 capsule 0  . Norethindrone Acetate-Ethinyl Estradiol (JUNEL,LOESTRIN,MICROGESTIN) 1.5-30 MG-MCG tablet Take 1 tablet by mouth daily. 1 Package 11  . tretinoin (RETIN-A) 0.05 % cream Apply topically at bedtime. For acne 45 g 1   No facility-administered medications prior to visit.     EXAM:  BP 116/80 mmHg  Temp(Src) 98.1 F (36.7 C) (Oral)  Wt 144 lb 12.8 oz (65.681 kg)  Body mass index is 23.55 kg/(m^2).  GENERAL: vitals reviewed and listed above, alert, oriented, appears well hydrated and in no acute distress HEENT: atraumatic, conjunctiva  clear, no obvious abnormalities on inspection of external nose and ears NECK: no obvious masses on inspection palpation  LUNGS: clear to auscultation bilaterally, no wheezes, rales or rhonchi, good air movement CV: HRRR, no clubbing cyanosis or  peripheral edema nl cap refill  Skin scattered facial acne some inflammatory mild to mod  Back clear  MS: moves all extremities without noticeable focal  abnormality PSYCH: pleasant and  cooperative, no obvious depression or anxiety  ASSESSMENT AND PLAN:  Discussed the following assessment and plan:  ADD (attention deficit disorder) probable   Medication management  Acne vulgaris Benefit more than risk of medications  to continue. rx printed  Inc dose retina    Contact us about formulary choices if needed  6 months  Wellness  Med check -Patient advised to return or notify health care team  if symptoms worsen ,persist or new concerns arise.  Patient Instructions  Continue medication as planned. Retry ocps  There are many options that may help.  If expensive then find out fumulary for  Covered  OCPS.  Try retina again with  ocps .     Wellness check med check in about 6 months or as needed    Acne Acne is a skin problem that causes pimples. Acne occurs when the pores  in the skin get blocked. The pores may become infected with bacteria, or they may become red, sore, and swollen. Acne is a common skin problem, especially for teenagers. Acne usually goes away over time. CAUSES Each pore contains an oil gland. Oil glands make an oily substance that is called sebum. Acne happens when these glands get plugged with sebum, dead skin cells, and dirt. Then, the bacteria that are normally found in the oil glands multiply and cause inflammation. Acne is commonly triggered by changes in your hormones. These hormonal changes can cause the oil glands to get bigger and to make more sebum. Factors that can make acne worse include:  Hormone changes during:  Adolescence.  Women's menstrual cycles.  Pregnancy.  Oil-based cosmetics and hair products.  Harshly scrubbing the skin.  Strong soaps.  Stress.  Hormone problems that are due to certain diseases.  Long or oily hair rubbing against the skin.  Certain medicines.  Pressure from headbands, backpacks, or shoulder pads.  Exposure to certain oils and chemicals. RISK FACTORS This condition is more likely to develop in:  Teenagers.  People who have a family history of acne. SYMPTOMS Acne often occurs on the face, neck, chest, and upper back. Symptoms include:  Small, red bumps (pimples or papules).  Whiteheads.  Blackheads.  Small, pus-filled pimples (pustules).  Big, red pimples or pustules that feel tender. More severe acne can cause:  An infected area that contains a collection of pus (abscess).  Hard, painful, fluid-filled sacs (cysts).  Scars. DIAGNOSIS This condition is diagnosed with a medical history and physical exam. Blood tests may also be done. TREATMENT Treatment for this condition can vary depending on the severity of your acne. Treatment may include:  Creams and lotions that prevent oil glands from clogging.  Creams and lotions that treat or prevent infections and  inflammation.  Antibiotic medicines that are applied to the skin or taken as a pill.  Pills that decrease sebum production.  Birth control pills.  Light or laser treatments.  Surgery.  Injections of medicine into the affected areas.  Chemicals that cause peeling of the skin. Your health care provider will also recommend the best way to take care of your skin. Good skin care is the most important part of treatment. HOME CARE INSTRUCTIONS Skin Care Take care of your skin as told by your health care provider. You may be told to do these things:  Wash your skin gently at least two times each day, as well as:  After you exercise.  Before you go to bed.  Use mild soap.  Apply a water-based  skin moisturizer after you wash your skin.  Use a sunscreen or sunblock with SPF 30 or greater. This is especially important if you are using acne medicines.  Choose cosmetics that will not plug your oil glands (are noncomedogenic). Medicines  Take over-the-counter and prescription medicines only as told by your health care provider.  If you were prescribed an antibiotic medicine, apply or take it as told by your health care provider. Do not stop taking the antibiotic even if your condition improves. General Instructions  Keep your hair clean and off of your face. If you have oily hair, shampoo your hair regularly or daily.  Avoid leaning your chin or forehead against your hands.  Avoid wearing tight headbands or hats.  Avoid picking or squeezing your pimples. That can make your acne worse and cause scarring.  Keep all follow-up visits as told by your health care provider. This is important.  Shave gently and only when necessary.  Keep a food journal to figure out if any foods are linked with your acne. SEEK MEDICAL CARE IF:  Your acne is not better after eight weeks.  Your acne gets worse.  You have a large area of skin that is red or tender.  You think that you are having  side effects from any acne medicine.   This information is not intended to replace advice given to you by your health care provider. Make sure you discuss any questions you have with your health care provider.   Document Released: 11/13/2000 Document Revised: 08/07/2015 Document Reviewed: 01/23/2015 Elsevier Interactive Patient Education 2016 [employer].      [place]. Cherylanne Ardelean M.D.

## 2016-01-20 NOTE — Patient Instructions (Signed)
Continue medication as planned. Retry ocps  There are many options that may help.  If expensive then find out fumulary for  Covered  OCPS.  Try retina again with  ocps .     Wellness check med check in about 6 months or as needed    Acne Acne is a skin problem that causes pimples. Acne occurs when the pores in the skin get blocked. The pores may become infected with bacteria, or they may become red, sore, and swollen. Acne is a common skin problem, especially for teenagers. Acne usually goes away over time. CAUSES Each pore contains an oil gland. Oil glands make an oily substance that is called sebum. Acne happens when these glands get plugged with sebum, dead skin cells, and dirt. Then, the bacteria that are normally found in the oil glands multiply and cause inflammation. Acne is commonly triggered by changes in your hormones. These hormonal changes can cause the oil glands to get bigger and to make more sebum. Factors that can make acne worse include:  Hormone changes during:  Adolescence.  Women's menstrual cycles.  Pregnancy.  Oil-based cosmetics and hair products.  Harshly scrubbing the skin.  Strong soaps.  Stress.  Hormone problems that are due to certain diseases.  Long or oily hair rubbing against the skin.  Certain medicines.  Pressure from headbands, backpacks, or shoulder pads.  Exposure to certain oils and chemicals. RISK FACTORS This condition is more likely to develop in:  Teenagers.  People who have a family history of acne. SYMPTOMS Acne often occurs on the face, neck, chest, and upper back. Symptoms include:  Small, red bumps (pimples or papules).  Whiteheads.  Blackheads.  Small, pus-filled pimples (pustules).  Big, red pimples or pustules that feel tender. More severe acne can cause:  An infected area that contains a collection of pus (abscess).  Hard, painful, fluid-filled sacs (cysts).  Scars. DIAGNOSIS This condition is  diagnosed with a medical history and physical exam. Blood tests may also be done. TREATMENT Treatment for this condition can vary depending on the severity of your acne. Treatment may include:  Creams and lotions that prevent oil glands from clogging.  Creams and lotions that treat or prevent infections and inflammation.  Antibiotic medicines that are applied to the skin or taken as a pill.  Pills that decrease sebum production.  Birth control pills.  Light or laser treatments.  Surgery.  Injections of medicine into the affected areas.  Chemicals that cause peeling of the skin. Your health care provider will also recommend the best way to take care of your skin. Good skin care is the most important part of treatment. HOME CARE INSTRUCTIONS Skin Care Take care of your skin as told by your health care provider. You may be told to do these things:  Wash your skin gently at least two times each day, as well as:  After you exercise.  Before you go to bed.  Use mild soap.  Apply a water-based skin moisturizer after you wash your skin.  Use a sunscreen or sunblock with SPF 30 or greater. This is especially important if you are using acne medicines.  Choose cosmetics that will not plug your oil glands (are noncomedogenic). Medicines  Take over-the-counter and prescription medicines only as told by your health care provider.  If you were prescribed an antibiotic medicine, apply or take it as told by your health care provider. Do not stop taking the antibiotic even if your condition improves. General  Instructions  Keep your hair clean and off of your face. If you have oily hair, shampoo your hair regularly or daily.  Avoid leaning your chin or forehead against your hands.  Avoid wearing tight headbands or hats.  Avoid picking or squeezing your pimples. That can make your acne worse and cause scarring.  Keep all follow-up visits as told by your health care provider. This  is important.  Shave gently and only when necessary.  Keep a food journal to figure out if any foods are linked with your acne. SEEK MEDICAL CARE IF:  Your acne is not better after eight weeks.  Your acne gets worse.  You have a large area of skin that is red or tender.  You think that you are having side effects from any acne medicine.   This information is not intended to replace advice given to you by your health care provider. Make sure you discuss any questions you have with your health care provider.   Document Released: 11/13/2000 Document Revised: 08/07/2015 Document Reviewed: 01/23/2015 Elsevier Interactive Patient Education [employer]

## 2016-02-17 ENCOUNTER — Encounter: Payer: Self-pay | Admitting: Internal Medicine

## 2016-02-17 ENCOUNTER — Other Ambulatory Visit: Payer: Self-pay | Admitting: Family Medicine

## 2016-07-02 NOTE — Progress Notes (Signed)
Pre visit review using our clinic review tool, if applicable. No additional management support is needed unless otherwise documented below in the visit note.  Chief Complaint  Patient presents with  . Annual Exam    HPI: Patient  Barbara Cuevas  30 y.o. comes in today for Preventive Health Care visit  And med check .  ADHD medication : takes daily helps focus get through h work and no sig se No significant side effects such as major sleep issues and mood changes, chest pain, shortness of breath, headaches , GI or significant weight loss.    OCPS taking continuously and thus no menes but no se and doing well Health Maintenance  Topic Date Due  . INFLUENZA VACCINE  06/30/2016  . PAP SMEAR  04/10/2017  . TETANUS/TDAP  03/24/2021  . HIV Screening  Completed   Health Maintenance Review LIFESTYLE:  Exercise:  runs Tobacco/ETS:n Alcohol: rare Sugar beverages:n Sleep: food  8  Drug use: no lmp  As above on continuous ocps [facility] 12 hours shifts Live alone apt on parents  Home     ROS:  GEN/ HEENT: No fever, significant weight changes sweats headaches vision problems hearing changes, CV/ PULM; No chest pain shortness of breath cough, syncope,edema  change in exercise tolerance. GI /GU: No adominal pain, vomiting, change in bowel habits. No blood in the stool. No significant GU symptoms. SKIN/HEME: ,no acute skin rashes suspicious lesions or bleeding. No lymphadenopathy, nodules, masses.  NEURO/ PSYCH:  No neurologic signs such as weakness numbness. No depression anxiety. IMM/ Allergy: No unusual infections.  Allergy .   REST of 12 system review negative except as per HPI   Past Medical History:  Diagnosis Date  . Hx of varicella     No past surgical history on file.  Family History  Problem Relation Age of Onset  . Heart disease Father 77    Open-heart surgery  . Diabetes type II      Parent,Grandparent  . Arthritis      Parent  . ADD / ADHD Brother      Social History   Social History  . Marital status: Single    Spouse name: N/A  . Number of children: N/A  . Years of education: N/A   Social History Main Topics  . Smoking status: Never Smoker  . Smokeless tobacco: Never Used  . Alcohol use Yes     Comment: socially  . Drug use: No  . Sexual activity: Not Asked   Other Topics Concern  . None   Social History Narrative   Currently working  [employer] 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    Household of 1   Lives and a residence attached to her family's house household of five   Regular sleep  8 hours    neg ets firearms.    Outpatient Medications Prior to Visit  Medication Sig Dispense Refill  . Norethindrone Acetate-Ethinyl Estradiol (JUNEL,LOESTRIN,MICROGESTIN) 1.5-30 MG-MCG tablet Take 1 tablet by mouth daily. 1 Package 11  . amphetamine-dextroamphetamine (ADDERALL XR) 20 MG 24 hr capsule Take 1 capsule (20 mg total) by mouth every morning. 30 capsule 0  . amphetamine-dextroamphetamine (ADDERALL XR) 20 MG 24 hr capsule Take 1 capsule (20 mg total) by mouth every morning. 30 capsule 0  . amphetamine-dextroamphetamine (ADDERALL XR) 20 MG 24 hr capsule Take 1 capsule (20 mg total) by mouth daily. 30 capsule 0  .  tretinoin (RETIN-A) 0.1 % cream Apply topically at bedtime. For acne 45 g 2   No facility-administered medications prior to visit.      EXAM:  BP 116/72 (BP Location: Right Arm, Patient Position: Sitting, Cuff Size: Normal)   Temp 98.5 F (36.9 C) (Oral)   Ht 5' 5.75" (1.67 m)   Wt 142 lb 12.8 oz (64.8 kg)   BMI 23.22 kg/m   Body mass index is 23.22 kg/m.  Physical Exam: Vital signs reviewed ZOX:WRUE is a well-developed well-nourished alert cooperative    who appearsr stated age in no acute distress.  HEENT: normocephalic atraumatic , Eyes: PERRL EOM's full, conjunctiva clear, Nares: paten,t no deformity discharge or tenderness., Ears: no deformity EAC's clear  TMs with normal landmarks. Mouth: clear OP, no lesions, edema.  Moist mucous membranes. Dentition in adequate repair. NECK: supple without masses, thyromegaly or bruits. CHEST/PULM:  Clear to auscultation and percussion breath sounds equal no wheeze , rales or rhonchi. No chest wall deformities or tenderness. CV: PMI is nondisplaced, S1 S2 no gallops, murmurs, rubs. Peripheral pulses are full without delay.No JVD . Breast: normal by inspection . No dimpling, discharge, masses, tenderness or discharge . ABDOMEN: Bowel sounds normal nontender  No guard or rebound, no hepato splenomegal no CVA tenderness.  No hernia. Extremtities:  No clubbing cyanosis or edema, no acute joint swelling or redness no focal atrophy NEURO:  Oriented x3, cranial nerves 3-12 appear to be intact, no obvious focal weakness,gait within normal limits no abnormal reflexes or asymmetrical SKIN: No acute rashes normal turgor, color, no bruising or petechiae. PSYCH: Oriented, good eye contact, no obvious depression anxiety, cognition and judgment appear normal. LN: no cervical axillary inguinal adenopathy  Lab Results  Component Value Date   WBC 8.1 07/19/2015   HGB 13.9 07/19/2015   HCT 40.9 07/19/2015   PLT 221.0 07/19/2015   GLUCOSE 81 07/19/2015   CHOL 142 07/19/2015   TRIG 41.0 07/19/2015   HDL 52.20 07/19/2015   LDLCALC 82 07/19/2015   ALT 18 07/19/2015   AST 18 07/19/2015   NA 141 07/19/2015   K 4.6 07/19/2015   CL 105 07/19/2015   CREATININE 0.74 07/19/2015   BUN 13 07/19/2015   CO2 29 07/19/2015   TSH 0.87 07/19/2015   HGBA1C 5.2 07/19/2015    ASSESSMENT AND PLAN:  Discussed the following assessment and plan:  Visit for preventive health examination - Plan: Basic metabolic panel, CBC with Differential/Platelet, Hepatic function panel, Lipid panel, TSH  Medication management  ADD (attention deficit disorder) probable  - benefit more than risk can continue . UDs today assured  refillrov 6  months  Oral contraceptive pill surveillance Lab screening  ( will need for work) Patient Care Team: Madelin Headings, MD as PCP - General (Internal Medicine) Patient Instructions  Continue lifestyle intervention healthy eating and exercise . Will notify you  of labs when available.  Can view on my chart also . ROV in 6 months  Med check   Healthy lifestyle includes : At least 150 minutes of exercise weeks  , weight at healthy levels, which is usually   BMI 19-25. Avoid trans fats and processed foods;  Increase fresh fruits and veges to 5 servings per day. And avoid sweet beverages including tea and juice. Mediterranean diet with olive oil and nuts have been noted to be heart and brain healthy . Avoid tobacco products . Limit  alcohol to  7 per week for women and 14 servings for  men.  Get adequate sleep . Wear seat belts . Don't text and drive .     Neta Mends. Quintavia Rogstad M.D.

## 2016-07-03 ENCOUNTER — Encounter: Payer: Self-pay | Admitting: Internal Medicine

## 2016-07-03 ENCOUNTER — Ambulatory Visit (INDEPENDENT_AMBULATORY_CARE_PROVIDER_SITE_OTHER): Payer: Commercial Managed Care - PPO | Admitting: Internal Medicine

## 2016-07-03 VITALS — BP 116/72 | Temp 98.5°F | Ht 65.75 in | Wt 142.8 lb

## 2016-07-03 DIAGNOSIS — F909 Attention-deficit hyperactivity disorder, unspecified type: Secondary | ICD-10-CM

## 2016-07-03 DIAGNOSIS — F988 Other specified behavioral and emotional disorders with onset usually occurring in childhood and adolescence: Secondary | ICD-10-CM

## 2016-07-03 DIAGNOSIS — Z0000 Encounter for general adult medical examination without abnormal findings: Secondary | ICD-10-CM | POA: Diagnosis not present

## 2016-07-03 DIAGNOSIS — Z79899 Other long term (current) drug therapy: Secondary | ICD-10-CM

## 2016-07-03 DIAGNOSIS — Z3041 Encounter for surveillance of contraceptive pills: Secondary | ICD-10-CM | POA: Diagnosis not present

## 2016-07-03 LAB — CBC WITH DIFFERENTIAL/PLATELET
BASOS ABS: 0 10*3/uL (ref 0.0–0.1)
Basophils Relative: 0.2 % (ref 0.0–3.0)
EOS ABS: 0 10*3/uL (ref 0.0–0.7)
Eosinophils Relative: 0.8 % (ref 0.0–5.0)
HCT: 37.2 % (ref 36.0–46.0)
Hemoglobin: 12.7 g/dL (ref 12.0–15.0)
LYMPHS ABS: 1.4 10*3/uL (ref 0.7–4.0)
Lymphocytes Relative: 25.9 % (ref 12.0–46.0)
MCHC: 34.2 g/dL (ref 30.0–36.0)
MCV: 91.3 fl (ref 78.0–100.0)
Monocytes Absolute: 0.2 10*3/uL (ref 0.1–1.0)
Monocytes Relative: 3.8 % (ref 3.0–12.0)
NEUTROS ABS: 3.8 10*3/uL (ref 1.4–7.7)
NEUTROS PCT: 69.3 % (ref 43.0–77.0)
PLATELETS: 190 10*3/uL (ref 150.0–400.0)
RBC: 4.07 Mil/uL (ref 3.87–5.11)
RDW: 12.6 % (ref 11.5–15.5)
WBC: 5.5 10*3/uL (ref 4.0–10.5)

## 2016-07-03 LAB — HEPATIC FUNCTION PANEL
ALBUMIN: 4.4 g/dL (ref 3.5–5.2)
ALK PHOS: 39 U/L (ref 39–117)
ALT: 18 U/L (ref 0–35)
AST: 16 U/L (ref 0–37)
BILIRUBIN DIRECT: 0.2 mg/dL (ref 0.0–0.3)
Total Bilirubin: 0.7 mg/dL (ref 0.2–1.2)
Total Protein: 6.9 g/dL (ref 6.0–8.3)

## 2016-07-03 LAB — LIPID PANEL
Cholesterol: 148 mg/dL (ref 0–200)
HDL: 54.2 mg/dL (ref >39.00)
LDL Cholesterol: 84 mg/dL (ref 0–99)
NONHDL: 93.43
TRIGLYCERIDES: 45 mg/dL (ref 0.0–149.0)
Total CHOL/HDL Ratio: 3
VLDL: 9 mg/dL (ref 0.0–40.0)

## 2016-07-03 LAB — BASIC METABOLIC PANEL
BUN: 10 mg/dL (ref 6–23)
CALCIUM: 9.3 mg/dL (ref 8.4–10.5)
CO2: 25 mEq/L (ref 19–32)
CREATININE: 0.7 mg/dL (ref 0.40–1.20)
Chloride: 104 mEq/L (ref 96–112)
GFR: 105.93 mL/min (ref >60.00)
GLUCOSE: 84 mg/dL (ref 70–99)
Potassium: 4 mEq/L (ref 3.5–5.1)
Sodium: 138 mEq/L (ref 135–145)

## 2016-07-03 LAB — TSH: TSH: 1.05 u[IU]/mL (ref 0.35–4.50)

## 2016-07-03 MED ORDER — AMPHETAMINE-DEXTROAMPHET ER 20 MG PO CP24: 20.0000 mg | ORAL_CAPSULE | Freq: Every day | ORAL | 0 refills | Status: DC

## 2016-07-03 MED ORDER — AMPHETAMINE-DEXTROAMPHET ER 20 MG PO CP24: 20.0000 mg | ORAL_CAPSULE | ORAL | 0 refills | Status: DC

## 2016-07-03 NOTE — Patient Instructions (Addendum)
Continue lifestyle intervention healthy eating and exercise . Will notify you  of labs when available.  Can view on my chart also . ROV in 6 months  Med check   Healthy lifestyle includes : At least 150 minutes of exercise weeks  , weight at healthy levels, which is usually   BMI 19-25. Avoid trans fats and processed foods;  Increase fresh fruits and veges to 5 servings per day. And avoid sweet beverages including tea and juice. Mediterranean diet with olive oil and nuts have been noted to be heart and brain healthy . Avoid tobacco products . Limit  alcohol to  7 per week for women and 14 servings for men.  Get adequate sleep . Wear seat belts . Don't text and drive .

## 2016-07-10 ENCOUNTER — Encounter: Payer: Self-pay | Admitting: Family Medicine

## 2016-10-21 ENCOUNTER — Other Ambulatory Visit: Payer: Self-pay | Admitting: Internal Medicine

## 2016-10-21 NOTE — Telephone Encounter (Signed)
Sent to the pharmacy by e-scribe for 6 months with 90 day supply.

## 2016-11-03 ENCOUNTER — Telehealth: Payer: Self-pay | Admitting: Internal Medicine

## 2016-11-03 NOTE — Telephone Encounter (Signed)
Pt request refill amphetamine-dextroamphetamine (ADDERALL XR) 20 MG 24 hr capsule °3 mo supply °

## 2016-11-03 NOTE — Telephone Encounter (Signed)
Ok to refill x 3 (90 days)   Med check due before runs out

## 2016-11-04 ENCOUNTER — Other Ambulatory Visit: Payer: Self-pay | Admitting: Internal Medicine

## 2016-11-04 ENCOUNTER — Encounter: Payer: Self-pay | Admitting: Internal Medicine

## 2016-11-04 MED ORDER — AMPHETAMINE-DEXTROAMPHET ER 20 MG PO CP24
20.0000 mg | ORAL_CAPSULE | Freq: Every day | ORAL | 0 refills | Status: DC
Start: 2016-11-04 — End: 2017-01-07

## 2016-11-04 MED ORDER — AMPHETAMINE-DEXTROAMPHET ER 20 MG PO CP24: 20.0000 mg | ORAL_CAPSULE | ORAL | 0 refills | Status: DC

## 2016-11-04 NOTE — Telephone Encounter (Signed)
Printed for WP to sign. 

## 2016-11-04 NOTE — Telephone Encounter (Signed)
error 

## 2016-11-04 NOTE — Telephone Encounter (Signed)
Left a message for a return call.  Pt needs to be notified that her rx is ready for pick up.

## 2016-11-04 NOTE — Telephone Encounter (Signed)
Pt sent a mychart message stating that she was following up on her request.  Sent a message back letting her know that her rx is available for pick up at the front desk.

## 2017-01-06 NOTE — Progress Notes (Signed)
Pre visit review using our clinic review tool, if applicable. No additional management support is needed unless otherwise documented below in the visit note.  Chief Complaint  Patient presents with  . Follow-up    HPI: Barbara Cuevas 31 y.o. come in for Chronic disease management    Fu adhd  meds She is doing well on OCPs regular periods wants to continue. ADHD Adderall XR works 4 days 7-7 [employer]  adderall xr  Takes At wakening  Pre work about 6 am Take most every day .   Good  Effect  that  Can focus and if forgets   Notes can tell at job  . More energy to focus.  No sig se sleep mood gi ha etc   Wants to continue  Needs refill Neg tad  ROS: See pertinent positives and negatives per HPI.  Past Medical History:  Diagnosis Date  . Hx of varicella     Family History  Problem Relation Age of Onset  . Heart disease Father 104    Open-heart surgery  . Diabetes type II      Parent,Grandparent  . Arthritis      Parent  . ADD / ADHD Brother     Social History   Social History  . Marital status: Single    Spouse name: N/A  . Number of children: N/A  . Years of education: N/A   Social History Main Topics  . Smoking status: Never Smoker  . Smokeless tobacco: Never Used  . Alcohol use Yes     Comment: socially  . Drug use: No  . Sexual activity: Not Asked   Other Topics Concern  . None   Social History Narrative   Currently working  [employer] 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    Household of 1   Lives and a residence attached to her family's house household of five   Regular sleep  8 hours    neg ets firearms.    Outpatient Medications Prior to Visit  Medication Sig Dispense Refill  . MICROGESTIN 1.5-30 MG-MCG tablet TAKE ONE TABLET BY MOUTH ONCE DAILY 3 Package 1  . amphetamine-dextroamphetamine (ADDERALL XR) 20 MG 24 hr capsule Take 1 capsule (20 mg total) by mouth every morning. 30 capsule 0  .  amphetamine-dextroamphetamine (ADDERALL XR) 20 MG 24 hr capsule Take 1 capsule (20 mg total) by mouth daily. 30 capsule 0  . amphetamine-dextroamphetamine (ADDERALL XR) 20 MG 24 hr capsule Take 1 capsule (20 mg total) by mouth every morning. 30 capsule 0   No facility-administered medications prior to visit.      EXAM:  BP 124/76 (BP Location: Right Arm, Patient Position: Sitting, Cuff Size: Normal)   Temp 98.8 F (37.1 C) (Oral)   Wt 148 lb (67.1 kg)   BMI 24.07 kg/m   Body mass index is 24.07 kg/m.  GENERAL: vitals reviewed and listed above, alert, oriented, appears well hydrated and in no acute distress HEENT: atraumatic, conjunctiva  clear, no obvious abnormalities on inspection of external nose and ears NECK: no obvious masses on inspection palpation  LUNGS: clear to auscultation bilaterally, no wheezes, rales or rhonchi, good air movement CV: HRRR, no clubbing cyanosis or  peripheral edema nl cap refill  MS: moves all extremities without noticeable focal  abnormality PSYCH: pleasant and cooperative, no obvious depression or anxiety Lab Results  Component Value Date   WBC 5.5  07/03/2016   HGB 12.7 07/03/2016   HCT 37.2 07/03/2016   PLT 190.0 07/03/2016   GLUCOSE 84 07/03/2016   CHOL 148 07/03/2016   TRIG 45.0 07/03/2016   HDL 54.20 07/03/2016   LDLCALC 84 07/03/2016   ALT 18 07/03/2016   AST 16 07/03/2016   NA 138 07/03/2016   K 4.0 07/03/2016   CL 104 07/03/2016   CREATININE 0.70 07/03/2016   BUN 10 07/03/2016   CO2 25 07/03/2016   TSH 1.05 07/03/2016   HGBA1C 5.2 07/19/2015   BP Readings from Last 3 Encounters:  01/07/17 124/76  07/03/16 116/72  01/20/16 116/80   Wt Readings from Last 3 Encounters:  01/07/17 148 lb (67.1 kg)  07/03/16 142 lb 12.8 oz (64.8 kg)  01/20/16 144 lb 12.8 oz (65.7 kg)     ASSESSMENT AND PLAN:  Discussed the following assessment and plan:  Attention deficit disorder, unspecified hyperactivity presence  Medication  management  Oral contraceptive use - continue  have pharmacy e contact for refills   Risk benefit of medication discussed. contniue refill due for uds summer  cpx  At  That time   -Patient advised to return or notify health care team  if  new concerns arise.  Patient Instructions   Get enough sleep  No change in plans.  Wellness check and med check in about 6 months     Neta MendsWanda K. Arrin Pintor M.D.

## 2017-01-07 ENCOUNTER — Ambulatory Visit (INDEPENDENT_AMBULATORY_CARE_PROVIDER_SITE_OTHER): Payer: Commercial Managed Care - PPO | Admitting: Internal Medicine

## 2017-01-07 ENCOUNTER — Encounter: Payer: Self-pay | Admitting: Internal Medicine

## 2017-01-07 VITALS — BP 124/76 | Temp 98.8°F | Wt 148.0 lb

## 2017-01-07 DIAGNOSIS — Z79899 Other long term (current) drug therapy: Secondary | ICD-10-CM | POA: Diagnosis not present

## 2017-01-07 DIAGNOSIS — F988 Other specified behavioral and emotional disorders with onset usually occurring in childhood and adolescence: Secondary | ICD-10-CM | POA: Diagnosis not present

## 2017-01-07 DIAGNOSIS — Z3041 Encounter for surveillance of contraceptive pills: Secondary | ICD-10-CM

## 2017-01-07 MED ORDER — AMPHETAMINE-DEXTROAMPHET ER 20 MG PO CP24
20.0000 mg | ORAL_CAPSULE | Freq: Every day | ORAL | 0 refills | Status: DC
Start: 2017-01-07 — End: 2017-07-06

## 2017-01-07 MED ORDER — AMPHETAMINE-DEXTROAMPHET ER 20 MG PO CP24: 20.0000 mg | ORAL_CAPSULE | ORAL | 0 refills | Status: DC

## 2017-01-07 NOTE — Patient Instructions (Signed)
  Get enough sleep  No change in plans.  Wellness check and med check in about 6 months

## 2017-02-28 ENCOUNTER — Other Ambulatory Visit: Payer: Self-pay | Admitting: Internal Medicine

## 2017-07-03 ENCOUNTER — Other Ambulatory Visit: Payer: Self-pay | Admitting: Internal Medicine

## 2017-07-04 ENCOUNTER — Encounter: Payer: Self-pay | Admitting: Internal Medicine

## 2017-07-05 NOTE — Telephone Encounter (Signed)
Ok to refill x 1   adderall She has upcoming  cpx appt.

## 2017-07-06 ENCOUNTER — Other Ambulatory Visit: Payer: Self-pay | Admitting: Emergency Medicine

## 2017-07-06 MED ORDER — AMPHETAMINE-DEXTROAMPHET ER 20 MG PO CP24: 20.0000 mg | ORAL_CAPSULE | ORAL | 0 refills | Status: DC

## 2017-07-06 MED ORDER — AMPHETAMINE-DEXTROAMPHET ER 20 MG PO CP24: 20.0000 mg | ORAL_CAPSULE | Freq: Every day | ORAL | 0 refills | Status: DC

## 2017-07-19 NOTE — Progress Notes (Signed)
Chief Complaint  Patient presents with  . Annual Exam    HPI: Patient  Barbara Cuevas  31 y.o. comes in today for Preventive Health Care visit  And fu ADHD   adhd  Using  Most day s.   Before work    Theme park manager on doing.  Rally helps  No sig se of med  Periods spotty at times and some missed  Not from preg    Using fro acne rx also    Health Maintenance  Topic Date Due  . PAP SMEAR  04/10/2017  . INFLUENZA VACCINE  06/30/2017  . TETANUS/TDAP  03/24/2021  . HIV Screening  Completed   Health Maintenance Review LIFESTYLE:  Exercise:  Runs  Not lately  1 -2 per week  Tobacco/ETS: no Alcohol:  1-2 few  Weeks  Sugar beverages: water Sleep:  About 7 hours   Drug use: no   HH of  1  Pet dog  Work: [employer]  7 - 7   lmp 2 months ago .    ROS:  GEN/ HEENT: No fever, significant weight changes sweats headaches vision problems hearing changes, CV/ PULM; No chest pain shortness of breath cough, syncope,edema  change in exercise tolerance. GI /GU: No adominal pain, vomiting, change in bowel habits. No blood in the stool. No significant GU symptoms. SKIN/HEME: ,no acute skin rashes suspicious lesions or bleeding. No lymphadenopathy, nodules, masses.  NEURO/ PSYCH:  No neurologic signs such as weakness numbness. No depression anxiety. IMM/ Allergy: No unusual infections.  Allergy .   REST of 12 system review negative except as per HPI   Past Medical History:  Diagnosis Date  . Hx of varicella     History reviewed. No pertinent surgical history.  Family History  Problem Relation Age of Onset  . Heart disease Father 62       Open-heart surgery  . Diabetes type II Unknown        Parent,Grandparent  . Arthritis Unknown        Parent  . ADD / ADHD Brother     Social History   Social History  . Marital status: Single    Spouse name: N/A  . Number of children: N/A  . Years of education: N/A   Social History Main Topics  . Smoking status: Never Smoker  . Smokeless  tobacco: Never Used  . Alcohol use Yes     Comment: socially  . Drug use: No  . Sexual activity: Not Asked   Other Topics Concern  . None   Social History Narrative   Currently working  [employer]. [place] at [employer]. 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    Outpatient Medications Prior to Visit  Medication Sig Dispense Refill  . amphetamine-dextroamphetamine (ADDERALL XR) 20 MG 24 hr capsule Take 1 capsule (20 mg total) by mouth every morning. 30 capsule 0  . amphetamine-dextroamphetamine (ADDERALL XR) 20 MG 24 hr capsule Take 1 capsule (20 mg total) by mouth daily. 30 capsule 0  . amphetamine-dextroamphetamine (ADDERALL XR) 20 MG 24 hr capsule Take 1 capsule (20 mg total) by mouth every morning. 30 capsule 0  . MICROGESTIN 1.5-30 MG-MCG tablet TAKE 1 TABLET  BY MOUTH ONCE DAILY 63 tablet 1   No facility-administered medications prior to visit.      EXAM:  BP 100/80 (BP Location: Right Arm, Patient Position: Sitting, Cuff Size: Normal)   Pulse 85   Temp 97.7 F (36.5 C) (Oral)   Ht 5' 5.5" (1.664 m)   Wt 148 lb 9.6 oz (67.4 kg)   BMI 24.35 kg/m   Body mass index is 24.35 kg/m. Wt Readings from Last 3 Encounters:  07/20/17 148 lb 9.6 oz (67.4 kg)  01/07/17 148 lb (67.1 kg)  07/03/16 142 lb 12.8 oz (64.8 kg)    Physical Exam: Vital signs reviewed XTG:GYIR is a well-developed well-nourished alert cooperative    who appearsr stated age in no acute distress.  HEENT: normocephalic atraumatic , Eyes: PERRL EOM's full, conjunctiva clear, Nares: paten,t no deformity discharge or tenderness., Ears: no deformity EAC's clear TMs with normal landmarks. Mouth: clear OP, no lesions, edema.  Moist mucous membranes. Dentition in adequate repair. NECK: supple without masses, thyromegaly or  bruits. CHEST/PULM:  Clear to auscultation and percussion breath sounds equal no wheeze , rales or rhonchi. No chest wall deformities or tenderness. Breast: normal by inspection . No dimpling, discharge, masses, tenderness or discharge . CV: PMI is nondisplaced, S1 S2 no gallops, murmurs, rubs. Peripheral pulses are full without delay.No JVD .  ABDOMEN: Bowel sounds normal nontender  No guard or rebound, no hepato splenomegal no CVA tenderness.  No hernia. Extremtities:  No clubbing cyanosis or edema, no acute joint swelling or redness no focal atrophy NEURO:  Oriented x3, cranial nerves 3-12 appear to be intact, no obvious focal weakness,gait within normal limits no abnormal reflexes or asymmetrical SKIN: No acute rashes normal turgor, color, no bruising or petechiae. Faded acne area no active  PSYCH: Oriented, good eye contact, no obvious depression anxiety, cognition and judgment appear normal. LN: no cervical axillary inguinal adenopathy Pelvic: NL ext GU, labia clear without lesions or rash . Vagina no lesions .Cervix: clear 1 + ectopy  UTERUS: Neg CMT Adnexa:  clear no masses . PAP done gc chl screen    Lab Results  Component Value Date   WBC 5.5 07/03/2016   HGB 12.7 07/03/2016   HCT 37.2 07/03/2016   PLT 190.0 07/03/2016   GLUCOSE 84 07/03/2016   CHOL 148 07/03/2016   TRIG 45.0 07/03/2016   HDL 54.20 07/03/2016   LDLCALC 84 07/03/2016   ALT 18 07/03/2016   AST 16 07/03/2016   NA 138 07/03/2016   K 4.0 07/03/2016   CL 104 07/03/2016   CREATININE 0.70 07/03/2016   BUN 10 07/03/2016   CO2 25 07/03/2016   TSH 1.05 07/03/2016   HGBA1C 5.2 07/19/2015    BP Readings from Last 3 Encounters:  07/20/17 100/80  01/07/17 124/76  07/03/16 116/72   Wt Readings from Last 3 Encounters:  07/20/17 148 lb 9.6 oz (67.4 kg)  01/07/17 148 lb (67.1 kg)  07/03/16 142 lb 12.8 oz (64.8 kg)       ASSESSMENT AND PLAN:  Discussed the following assessment and plan:  Visit for  preventive health examination  Attention deficit disorder, unspecified hyperactivity presence  Medication management  Oral contraceptive pill surveillance  Encounter for gynecological examination without abnormal finding - Plan: Cytology - PAP  Pap smear for cervical cancer screening - Plan: Cytology - PAP Benefit more than risk of medications  to continue. I think the lights or lack of withdrawal bleeding is related to low-dose pills for  a while. Discussed if not from pregnancy which she is tested and not dangerous this time however those 3 months is without withdrawal bleeding contact us for change in formulation of her OCP. Patient Care Team: Arrin Pintor, Standley Brooking, MD as PCP - General (Internal Medicine) Patient Instructions   Continue attention to  lifestyle intervention healthy eating and exercise . ROV for med check in 6 months or as needed  We could change the formulation of ocp if skipping 3 months at a time Let me know      Preventive Care 18-39 Years, Female Preventive care refers to lifestyle choices and visits with your health care provider that can promote health and wellness. What does preventive care include?  A yearly physical exam. This is also called an annual well check.  Dental exams once or twice a year.  Routine eye exams. Ask your health care provider how often you should have your eyes checked.  Personal lifestyle choices, including: ? Daily care of your teeth and gums. ? Regular physical activity. ? Eating a healthy diet. ? Avoiding tobacco and drug use. ? Limiting alcohol use. ? Practicing safe sex. ? Taking vitamin and mineral supplements as recommended by your health care provider. What happens during an annual well check? The services and screenings done by your health care provider during your annual well check will depend on your age, overall health, lifestyle risk factors, and family history of disease. Counseling Your health care provider may  ask you questions about your:  Alcohol use.  Tobacco use.  Drug use.  Emotional well-being.  Home and relationship well-being.  Sexual activity.  Eating habits.  Work and work Statistician.  Method of birth control.  Menstrual cycle.  Pregnancy history.  Screening You may have the following tests or measurements:  Height, weight, and BMI.  Diabetes screening. This is done by checking your blood sugar (glucose) after you have not eaten for a while (fasting).  Blood pressure.  Lipid and cholesterol levels. These may be checked every 5 years starting at age 33.  Skin check.  Hepatitis C blood test.  Hepatitis B blood test.  Sexually transmitted disease (STD) testing.  BRCA-related cancer screening. This may be done if you have a family history of breast, ovarian, tubal, or peritoneal cancers.  Pelvic exam and Pap test. This may be done every 3 years starting at age 110. Starting at age 32, this may be done every 5 years if you have a Pap test in combination with an HPV test.  Discuss your test results, treatment options, and if necessary, the need for more tests with your health care provider. Vaccines Your health care provider may recommend certain vaccines, such as:  Influenza vaccine. This is recommended every year.  Tetanus, diphtheria, and acellular pertussis (Tdap, Td) vaccine. You may need a Td booster every 10 years.  Varicella vaccine. You may need this if you have not been vaccinated.  HPV vaccine. If you are 58 or younger, you may need three doses over 6 months.  Measles, mumps, and rubella (MMR) vaccine. You may need at least one dose of MMR. You may also need a second dose.  Pneumococcal 13-valent conjugate (PCV13) vaccine. You may need this if you have certain conditions and were not previously vaccinated.  Pneumococcal polysaccharide (PPSV23) vaccine. You may need one or two doses if you smoke cigarettes or if you have certain  conditions.  Meningococcal vaccine. One dose is recommended if you are age 25-21 years and  a Market researcher living in a residence hall, or if you have one of several medical conditions. You may also need additional booster doses.  Hepatitis A vaccine. You may need this if you have certain conditions or if you travel or work in places where you may be exposed to hepatitis A.  Hepatitis B vaccine. You may need this if you have certain conditions or if you travel or work in places where you may be exposed to hepatitis B.  Haemophilus influenzae type b (Hib) vaccine. You may need this if you have certain risk factors.  Talk to your health care provider about which screenings and vaccines you need and how often you need them. This information is not intended to replace advice given to you by your health care provider. Make sure you discuss any questions you have with your health care provider. Document Released: 01/12/2002 Document Revised: 08/05/2016 Document Reviewed: 09/17/2015 Elsevier Interactive Patient Education  2017 [place] Results  Component Value Date   WBC 5.5 07/03/2016   HGB 12.7 07/03/2016   HCT 37.2 07/03/2016   PLT 190.0 07/03/2016   GLUCOSE 84 07/03/2016   CHOL 148 07/03/2016   TRIG 45.0 07/03/2016   HDL 54.20 07/03/2016   LDLCALC 84 07/03/2016   ALT 18 07/03/2016   AST 16 07/03/2016   NA 138 07/03/2016   K 4.0 07/03/2016   CL 104 07/03/2016   CREATININE 0.70 07/03/2016   BUN 10 07/03/2016   CO2 25 07/03/2016   TSH 1.05 07/03/2016   HGBA1C 5.2 07/19/2015       Mariann Laster K. Colleena Kurtenbach M.D.

## 2017-07-20 ENCOUNTER — Other Ambulatory Visit (HOSPITAL_COMMUNITY)
Admission: RE | Admit: 2017-07-20 | Discharge: 2017-07-20 | Disposition: A | Discharge status: Home / Self Care | Payer: Commercial Managed Care - PPO | Source: Ambulatory Visit | Attending: Internal Medicine | Admitting: Internal Medicine

## 2017-07-20 ENCOUNTER — Encounter: Payer: Self-pay | Admitting: Internal Medicine

## 2017-07-20 ENCOUNTER — Ambulatory Visit (INDEPENDENT_AMBULATORY_CARE_PROVIDER_SITE_OTHER): Payer: Commercial Managed Care - PPO | Admitting: Internal Medicine

## 2017-07-20 VITALS — BP 100/80 | HR 85 | Temp 97.7°F | Ht 65.5 in | Wt 148.6 lb

## 2017-07-20 DIAGNOSIS — Z0000 Encounter for general adult medical examination without abnormal findings: Secondary | ICD-10-CM

## 2017-07-20 DIAGNOSIS — Z01419 Encounter for gynecological examination (general) (routine) without abnormal findings: Secondary | ICD-10-CM | POA: Diagnosis not present

## 2017-07-20 DIAGNOSIS — Z124 Encounter for screening for malignant neoplasm of cervix: Secondary | ICD-10-CM

## 2017-07-20 DIAGNOSIS — F988 Other specified behavioral and emotional disorders with onset usually occurring in childhood and adolescence: Secondary | ICD-10-CM | POA: Diagnosis not present

## 2017-07-20 DIAGNOSIS — Z3041 Encounter for surveillance of contraceptive pills: Secondary | ICD-10-CM | POA: Diagnosis not present

## 2017-07-20 DIAGNOSIS — Z79899 Other long term (current) drug therapy: Secondary | ICD-10-CM | POA: Diagnosis not present

## 2017-07-20 NOTE — Patient Instructions (Addendum)
Continue attention to  lifestyle intervention healthy eating and exercise . ROV for med check in 6 months or as needed  We could change the formulation of ocp if skipping 3 months at a time Let me know      Preventive Care 18-39 Years, Female Preventive care refers to lifestyle choices and visits with your health care provider that can promote health and wellness. What does preventive care include?  A yearly physical exam. This is also called an annual well check.  Dental exams once or twice a year.  Routine eye exams. Ask your health care provider how often you should have your eyes checked.  Personal lifestyle choices, including: ? Daily care of your teeth and gums. ? Regular physical activity. ? Eating a healthy diet. ? Avoiding tobacco and drug use. ? Limiting alcohol use. ? Practicing safe sex. ? Taking vitamin and mineral supplements as recommended by your health care provider. What happens during an annual well check? The services and screenings done by your health care provider during your annual well check will depend on your age, overall health, lifestyle risk factors, and family history of disease. Counseling Your health care provider may ask you questions about your:  Alcohol use.  Tobacco use.  Drug use.  Emotional well-being.  Home and relationship well-being.  Sexual activity.  Eating habits.  Work and work Statistician.  Method of birth control.  Menstrual cycle.  Pregnancy history.  Screening You may have the following tests or measurements:  Height, weight, and BMI.  Diabetes screening. This is done by checking your blood sugar (glucose) after you have not eaten for a while (fasting).  Blood pressure.  Lipid and cholesterol levels. These may be checked every 5 years starting at age 93.  Skin check.  Hepatitis C blood test.  Hepatitis B blood test.  Sexually transmitted disease (STD) testing.  BRCA-related cancer screening. This  may be done if you have a family history of breast, ovarian, tubal, or peritoneal cancers.  Pelvic exam and Pap test. This may be done every 3 years starting at age 31. Starting at age 36, this may be done every 5 years if you have a Pap test in combination with an HPV test.  Discuss your test results, treatment options, and if necessary, the need for more tests with your health care provider. Vaccines Your health care provider may recommend certain vaccines, such as:  Influenza vaccine. This is recommended every year.  Tetanus, diphtheria, and acellular pertussis (Tdap, Td) vaccine. You may need a Td booster every 10 years.  Varicella vaccine. You may need this if you have not been vaccinated.  HPV vaccine. If you are 63 or younger, you may need three doses over 6 months.  Measles, mumps, and rubella (MMR) vaccine. You may need at least one dose of MMR. You may also need a second dose.  Pneumococcal 13-valent conjugate (PCV13) vaccine. You may need this if you have certain conditions and were not previously vaccinated.  Pneumococcal polysaccharide (PPSV23) vaccine. You may need one or two doses if you smoke cigarettes or if you have certain conditions.  Meningococcal vaccine. One dose is recommended if you are age 93-21 years and a first-year college student living in a residence hall, or if you have one of several medical conditions. You may also need additional booster doses.  Hepatitis A vaccine. You may need this if you have certain conditions or if you travel or work in places where you may be exposed  to hepatitis A.  Hepatitis B vaccine. You may need this if you have certain conditions or if you travel or work in places where you may be exposed to hepatitis B.  Haemophilus influenzae type b (Hib) vaccine. You may need this if you have certain risk factors.  Talk to your health care provider about which screenings and vaccines you need and how often you need them. This  information is not intended to replace advice given to you by your health care provider. Make sure you discuss any questions you have with your health care provider. Document Released: 01/12/2002 Document Revised: 08/05/2016 Document Reviewed: 09/17/2015 Elsevier Interactive Patient Education  2017 [place] Results  Component Value Date   WBC 5.5 07/03/2016   HGB 12.7 07/03/2016   HCT 37.2 07/03/2016   PLT 190.0 07/03/2016   GLUCOSE 84 07/03/2016   CHOL 148 07/03/2016   TRIG 45.0 07/03/2016   HDL 54.20 07/03/2016   LDLCALC 84 07/03/2016   ALT 18 07/03/2016   AST 16 07/03/2016   NA 138 07/03/2016   K 4.0 07/03/2016   CL 104 07/03/2016   CREATININE 0.70 07/03/2016   BUN 10 07/03/2016   CO2 25 07/03/2016   TSH 1.05 07/03/2016   HGBA1C 5.2 07/19/2015

## 2017-07-21 LAB — CYTOLOGY - PAP
Chlamydia: NEGATIVE
DIAGNOSIS: NEGATIVE
Neisseria Gonorrhea: NEGATIVE

## 2017-08-20 ENCOUNTER — Encounter: Payer: Self-pay | Admitting: Internal Medicine

## 2017-09-30 ENCOUNTER — Other Ambulatory Visit: Payer: Self-pay | Admitting: Internal Medicine

## 2017-10-01 MED ORDER — AMPHETAMINE-DEXTROAMPHET ER 20 MG PO CP24: 20.0000 mg | ORAL_CAPSULE | Freq: Every day | ORAL | 0 refills | Status: DC

## 2017-10-01 MED ORDER — AMPHETAMINE-DEXTROAMPHET ER 20 MG PO CP24: 20.0000 mg | ORAL_CAPSULE | ORAL | 0 refills | Status: DC

## 2017-10-01 NOTE — Telephone Encounter (Signed)
Ok Printed 

## 2017-10-01 NOTE — Telephone Encounter (Signed)
Refill request for Medication: Adderall 20mg   Last Filled: 07/06/17 x 3 rx's, #30 each Previous / Upcoming Appt: 01/20/18 upcoming  Please advise Dr Fabian SharpPanosh, thanks.

## 2017-10-30 ENCOUNTER — Other Ambulatory Visit: Payer: Self-pay | Admitting: Internal Medicine

## 2017-11-02 ENCOUNTER — Encounter: Payer: Self-pay | Admitting: Internal Medicine

## 2017-11-02 MED ORDER — NORETHINDRONE ACET-ETHINYL EST 1.5-30 MG-MCG PO TABS: 1.0000 | ORAL_TABLET | Freq: Every day | ORAL | 0 refills | Status: DC

## 2017-11-02 NOTE — Telephone Encounter (Signed)
This has been refilled to the patient's pharmacy. Nothing further needed.

## 2018-01-03 ENCOUNTER — Other Ambulatory Visit: Payer: Self-pay | Admitting: Internal Medicine

## 2018-01-04 ENCOUNTER — Encounter: Payer: Self-pay | Admitting: Internal Medicine

## 2018-01-18 NOTE — Progress Notes (Signed)
Chief Complaint  Patient presents with  . Follow-up    med check, adderall refills    HPI: Barbara DareJulie D Cuevas 32 y.o. come in for  Fu adhd med and ocps periods   Adderalll in am   7-8 am .   Every am   And   Still helps   Nose ? If mood.  Able to  stay focused .  Working  3 days  12 hours.   Good enough .  Seems to be helpful   Periods no for a while   Had had irreg menses on pills no missed pills  Neg tad.   1-2 a week.    Rd no . Sleep  Bed by 10 pm  6-7     ROS: See pertinent positives and negatives per HPI.  Past Medical History:  Diagnosis Date  . Hx of varicella     Family History  Problem Relation Age of Onset  . Heart disease Father 6055       Open-heart surgery  . Diabetes type II Unknown        Parent,Grandparent  . Arthritis Unknown        Parent  . ADD / ADHD Brother     Social History   Socioeconomic History  . Marital status: Single    Spouse name: None  . Number of children: None  . Years of education: None  . Highest education level: None  Social Needs  . Financial resource strain: None  . Food insecurity - worry: None  . Food insecurity - inability: None  . Transportation needs - medical: None  . Transportation needs - non-medical: None  Occupational History  . None  Tobacco Use  . Smoking status: Never Smoker  . Smokeless tobacco: Never Used  Substance and Sexual Activity  . Alcohol use: Yes    Comment: socially  . Drug use: No  . Sexual activity: None  Other Topics Concern  . None  Social History Narrative   Currently working  [employer] [facility] [employer]    [employer]ing [employer] at 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    Outpatient Medications Prior to Visit  Medication Sig Dispense Refill  . MICROGESTIN 1.5-30 MG-MCG tablet TAKE 1 TABLET BY MOUTH ONCE DAILY 63 tablet 0    . amphetamine-dextroamphetamine (ADDERALL XR) 20 MG 24 hr capsule Take 1 capsule (20 mg total) by mouth every morning. 30 capsule 0  . amphetamine-dextroamphetamine (ADDERALL XR) 20 MG 24 hr capsule Take 1 capsule (20 mg total) by mouth daily. 30 capsule 0  . amphetamine-dextroamphetamine (ADDERALL XR) 20 MG 24 hr capsule Take 1 capsule (20 mg total) by mouth every morning. 30 capsule 0   No facility-administered medications prior to visit.      EXAM:  BP 118/70 (BP Location: Right Arm, Patient Position: Sitting, Cuff Size: Normal)   Pulse (!) 113   Temp 98.2 F (36.8 C) (Oral)   Wt 150 lb 6.4 oz (68.2 kg)   BMI 24.65 kg/m   Body mass index is 24.65 kg/m.  GENERAL: vitals reviewed and listed above, alert, oriented, appears well hydrated and in no acute distress HEENT: atraumatic, conjunctiva  clear, no obvious abnormalities on inspection of external nose  and ears  NECK: no obvious masses on inspection palpation  LUNGS: clear to auscultation bilaterally, no wheezes, rales or rhonchi, good air movement CV: HRRR, no clubbing cyanosis or  peripheral edema nl cap refill  MS: moves all extremities without noticeable focal  abnormality PSYCH: pleasant and cooperative, no obvious depression or anxiety Lab Results  Component Value Date   WBC 5.5 07/03/2016   HGB 12.7 07/03/2016   HCT 37.2 07/03/2016   PLT 190.0 07/03/2016   GLUCOSE 84 07/03/2016   CHOL 148 07/03/2016   TRIG 45.0 07/03/2016   HDL 54.20 07/03/2016   LDLCALC 84 07/03/2016   ALT 18 07/03/2016   AST 16 07/03/2016   NA 138 07/03/2016   K 4.0 07/03/2016   CL 104 07/03/2016   CREATININE 0.70 07/03/2016   BUN 10 07/03/2016   CO2 25 07/03/2016   TSH 1.05 07/03/2016   HGBA1C 5.2 07/19/2015   BP Readings from Last 3 Encounters:  01/20/18 118/70  07/20/17 100/80  01/07/17 124/76    ASSESSMENT AND PLAN:  Discussed the following assessment and plan:  Attention deficit disorder, unspecified hyperactivity  presence  Medication management - Plan: Pain Mgmt, Profile 8 w/Conf, U  Oral contraceptive pill surveillance  Missed periods - ? is on  continuous  ocps  To x screen today   Benefit more than risk of medications  to continue.  packaging is  3 weeks and has been taking continuously     No wd week. At thist ime have her  Take a week  Off for wd bleeding in  between packs and see what  Pattern in at that time .  Told her can get ucg if any missed pills but probably the  Medication .   consideration of change opc or  More work up if    No wd bleeding -Patient advised to return or notify health care team  if  new concerns arise.  Patient Instructions    Take week off every month as planned for withdrawal bleeding time.  For the next 3 cycles   And let me know the pattern of bleeding .    Ok to remain on same med   For now   If all ok then  6 months CPX  PV or  Med check     Neta Mends. Krrish Freund M.D.

## 2018-01-20 ENCOUNTER — Ambulatory Visit: Payer: PRIVATE HEALTH INSURANCE | Admitting: Internal Medicine

## 2018-01-20 ENCOUNTER — Encounter: Payer: Self-pay | Admitting: Internal Medicine

## 2018-01-20 VITALS — BP 118/70 | HR 113 | Temp 98.2°F | Wt 150.4 lb

## 2018-01-20 DIAGNOSIS — N926 Irregular menstruation, unspecified: Secondary | ICD-10-CM | POA: Diagnosis not present

## 2018-01-20 DIAGNOSIS — Z79899 Other long term (current) drug therapy: Secondary | ICD-10-CM | POA: Diagnosis not present

## 2018-01-20 DIAGNOSIS — Z3041 Encounter for surveillance of contraceptive pills: Secondary | ICD-10-CM

## 2018-01-20 DIAGNOSIS — F988 Other specified behavioral and emotional disorders with onset usually occurring in childhood and adolescence: Secondary | ICD-10-CM | POA: Diagnosis not present

## 2018-01-20 MED ORDER — AMPHETAMINE-DEXTROAMPHET ER 20 MG PO CP24: 20.0000 mg | ORAL_CAPSULE | ORAL | 0 refills | Status: DC

## 2018-01-20 MED ORDER — AMPHETAMINE-DEXTROAMPHET ER 20 MG PO CP24: 20.0000 mg | ORAL_CAPSULE | Freq: Every day | ORAL | 0 refills | Status: DC

## 2018-01-20 NOTE — Patient Instructions (Addendum)
   Take week off every month as planned for withdrawal bleeding time.  For the next 3 cycles   And let me know the pattern of bleeding .    Ok to remain on same med   For now   If all ok then  6 months CPX  PV or  Med check

## 2018-01-24 LAB — PAIN MGMT, PROFILE 8 W/CONF, U
6 ACETYLMORPHINE: NEGATIVE ng/mL (ref <10)
Alcohol Metabolites: POSITIVE ng/mL — AB (ref <500)
Amphetamine: 7012 ng/mL — ABNORMAL HIGH (ref <250)
Amphetamines: POSITIVE ng/mL — AB (ref <500)
BENZODIAZEPINES: NEGATIVE ng/mL (ref <100)
BUPRENORPHINE, URINE: NEGATIVE ng/mL (ref <5)
Cocaine Metabolite: NEGATIVE ng/mL (ref <150)
Creatinine: 151.8 mg/dL
ETHYL SULFATE (ETS): 1264 ng/mL — AB (ref <100)
Ethyl Glucuronide (ETG): 21539 ng/mL — ABNORMAL HIGH (ref <500)
MARIJUANA METABOLITE: NEGATIVE ng/mL (ref <20)
MDMA: NEGATIVE ng/mL (ref <500)
Methamphetamine: NEGATIVE ng/mL (ref <250)
OPIATES: NEGATIVE ng/mL (ref <100)
OXYCODONE: NEGATIVE ng/mL (ref <100)
Oxidant: NEGATIVE ug/mL (ref <200)
pH: 7.11 (ref 4.5–9.0)

## 2018-03-28 ENCOUNTER — Other Ambulatory Visit: Payer: Self-pay | Admitting: Internal Medicine

## 2018-06-01 ENCOUNTER — Other Ambulatory Visit: Payer: Self-pay | Admitting: Internal Medicine

## 2018-06-08 ENCOUNTER — Other Ambulatory Visit: Payer: Self-pay | Admitting: Internal Medicine

## 2018-06-09 NOTE — Telephone Encounter (Signed)
Please advise 

## 2018-06-10 MED ORDER — AMPHETAMINE-DEXTROAMPHET ER 20 MG PO CP24: 20.0000 mg | ORAL_CAPSULE | Freq: Every day | ORAL | 0 refills | Status: DC

## 2018-06-10 MED ORDER — AMPHETAMINE-DEXTROAMPHET ER 20 MG PO CP24: 20.0000 mg | ORAL_CAPSULE | ORAL | 0 refills | Status: DC

## 2018-06-10 NOTE — Telephone Encounter (Signed)
Sent in electronically .  

## 2018-07-08 ENCOUNTER — Other Ambulatory Visit: Payer: Self-pay

## 2018-07-20 NOTE — Progress Notes (Signed)
Chief Complaint  Patient presents with  . Annual Exam    no new concerns     HPI: Patient  Barbara Cuevas  32 y.o. comes in today for Preventive Health Care visit   And med evaluation   Last pap  A year ago .   No problems   21 day pack  No sx   adderall  Takes in 8 am  Not after 9    Work 12 hours shifts  3 days   Cardiac.  Floor still helpful for care no untoward se   Health Maintenance  Topic Date Due  . INFLUENZA VACCINE  06/30/2018  . PAP SMEAR  07/20/2020  . TETANUS/TDAP  03/24/2021  . HIV Screening  Completed   Health Maintenance Review LIFESTYLE:  Exercise:   Work.  House  hold  Tobacco/ETS: no Alcohol:   ocass Sugar beverages: Sleep:  Average 7-8  Drug use: no HH of   1-2  Dog  Work: 12 x 3  Last pap 2018 ocps   Periods    ROS:  GEN/ HEENT: No fever, significant weight changes sweats headaches vision problems hearing changes, CV/ PULM; No chest pain shortness of breath cough, syncope,edema  change in exercise tolerance. GI /GU: No adominal pain, vomiting, change in bowel habits. No blood in the stool. No significant GU symptoms. SKIN/HEME: ,no acute skin rashes suspicious lesions or bleeding. No lymphadenopathy, nodules, masses.  NEURO/ PSYCH:  No neurologic signs such as weakness numbness. No depression anxiety. IMM/ Allergy: No unusual infections.  Allergy .   REST of 12 system review negative except as per HPI   Past Medical History:  Diagnosis Date  . Hx of varicella     No past surgical history on file.  Family History  Problem Relation Age of Onset  . Heart disease Father 88       Open-heart surgery  . Diabetes type II Unknown        Parent,Grandparent  . Arthritis Unknown        Parent  . ADD / ADHD Brother     Social History   Socioeconomic History  . Marital status: Single    Spouse name: Not on file  . Number of children: Not on file  . Years of education: Not on file  . Highest education level: Not on file  Occupational  History  . Not on file  Social Needs  . Financial resource strain: Not on file  . Food insecurity:    Worry: Not on file    Inability: Not on file  . Transportation needs:    Medical: Not on file    Non-medical: Not on file  Tobacco Use  . Smoking status: Never Smoker  . Smokeless tobacco: Never Used  Substance and Sexual Activity  . Alcohol use: Yes    Comment: socially  . Drug use: No  . Sexual activity: Not on file  Lifestyle  . Physical activity:    Days per week: Not on file    Minutes per session: Not on file  . Stress: Not on file  Relationships  . Social connections:    Talks on phone: Not on file    Gets together: Not on file    Attends religious service: Not on file    Active member of club or organization: Not on file    Attends meetings of clubs or organizations: Not on file    Relationship status: Not on file  Other Topics  Concern  . 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    Outpatient Medications Prior to Visit  Medication Sig Dispense Refill  . amphetamine-dextroamphetamine (ADDERALL XR) 20 MG 24 hr capsule Take 1 capsule (20 mg total) by mouth every morning. 30 capsule 0  . amphetamine-dextroamphetamine (ADDERALL XR) 20 MG 24 hr capsule Take 1 capsule (20 mg total) by mouth daily. 30 capsule 0  . amphetamine-dextroamphetamine (ADDERALL XR) 20 MG 24 hr capsule Take 1 capsule (20 mg total) by mouth every morning. 30 capsule 0  . MICROGESTIN 1.5-30 MG-MCG tablet TAKE 1 TABLET BY MOUTH EVERY DAY 42 tablet 0   No facility-administered medications prior to visit.      EXAM:  BP 96/66 (BP Location: Right Arm, Patient Position: Sitting, Cuff Size: Normal)   Pulse 82   Temp 98.5 F  (36.9 C) (Oral)   Ht 5' 5.25" (1.657 m)   Wt 140 lb 3.2 oz (63.6 kg)   BMI 23.15 kg/m   Body mass index is 23.15 kg/m. Wt Readings from Last 3 Encounters:  07/22/18 140 lb 3.2 oz (63.6 kg)  01/20/18 150 lb 6.4 oz (68.2 kg)  07/20/17 148 lb 9.6 oz (67.4 kg)    Physical Exam: Vital signs reviewed MVE:HMCN is a well-developed well-nourished alert cooperative    who appearsr stated age in no acute distress.  HEENT: normocephalic atraumatic , Eyes: PERRL EOM's full, conjunctiva clear, Nares: paten,t no deformity discharge or tenderness., Ears: no deformity EAC's clear TMs with normal landmarks. Mouth: clear OP, no lesions, edema.  Moist mucous membranes. Dentition in adequate repair. NECK: supple without masses, thyromegaly or bruits. CHEST/PULM:  Clear to auscultation and percussion breath sounds equal no wheeze , rales or rhonchi. No chest wall deformities or tenderness. Breast: normal by inspection . No dimpling, discharge, masses, tenderness or discharge . CV: PMI is nondisplaced, S1 S2 no gallops, murmurs, rubs. Peripheral pulses are full without delay.No JVD .  ABDOMEN: Bowel sounds normal nontender  No guard or rebound, no hepato splenomegal no CVA tenderness.  No hernia. Extremtities:  No clubbing cyanosis or edema, no acute joint swelling or redness no focal atrophy NEURO:  Oriented x3, cranial nerves 3-12 appear to be intact, no obvious focal weakness,gait within normal limits no abnormal reflexes or asymmetrical SKIN: No acute rashes normal turgor, color, no bruising or petechiae. Faded acne  PSYCH: Oriented, good eye contact, no obvious depression anxiety, cognition and judgment appear normal. LN: no cervical axillary inguinal adenopathy  Lab Results  Component Value Date   WBC 4.5 07/22/2018   HGB 13.2 07/22/2018   HCT 39.8 07/22/2018   PLT 201.0 07/22/2018   GLUCOSE 88 07/22/2018   CHOL 147 07/22/2018   TRIG 46.0 07/22/2018   HDL 56.60 07/22/2018   LDLCALC 81  07/22/2018   ALT 25 07/22/2018   AST 16 07/22/2018   NA 136 07/22/2018   K 4.3 07/22/2018   CL 101 07/22/2018   CREATININE 0.72 07/22/2018   BUN 12 07/22/2018   CO2 29 07/22/2018   TSH 1.11 07/22/2018   HGBA1C 5.2 07/19/2015    BP Readings  from Last 3 Encounters:  07/22/18 96/66  01/20/18 118/70  07/20/17 100/80     ASSESSMENT AND PLAN:  Discussed the following assessment and plan:  Visit for preventive health examination - Plan: Basic metabolic panel, CBC with Differential/Platelet, Hepatic function panel, Lipid panel, TSH  Medication management - Plan: Basic metabolic panel, CBC with Differential/Platelet, Hepatic function panel, Lipid panel, TSH  Attention deficit disorder, unspecified hyperactivity presence - Plan: Basic metabolic panel, CBC with Differential/Platelet, Hepatic function panel, Lipid panel, TSH  Screening, lipid - Plan: Basic metabolic panel, CBC with Differential/Platelet, Hepatic function panel, Lipid panel, TSH  Oral contraceptive pill surveillance - Plan: Basic metabolic panel, CBC with Differential/Platelet, Hepatic function panel, Lipid panel, TSH  Screening for HIV (human immunodeficiency virus) - Plan: HIV antibody Benefit more than risk of medications  to continue.  Contact us when due to send in [facility] local  [facility]  Send 3 mos NCmeds to [place] in 6 mos unless needed  For med check  She will get flu vaccine at work  Patient Care Team: Regis Bill, Standley Brooking, MD as PCP - General (Internal Medicine) Patient Instructions  Continue lifestyle intervention healthy eating and exercise .  contact us ofr refills adderall   ROV in about 6 mponths med check   Health Maintenance, Female Adopting a healthy lifestyle and getting preventive care can go a long way to promote health and wellness. Talk with your health care provider about what schedule of regular examinations is right for you. This is a good chance for you to check in with  your provider about disease prevention and staying healthy. In between checkups, there are plenty of things you can do on your own. Experts have done a lot of research about which lifestyle changes and preventive measures are most likely to keep you healthy. Ask your health care provider for more information. Weight and diet Eat a healthy diet  Be sure to include plenty of vegetables, fruits, low-fat dairy products, and lean protein.  Do not eat a lot of foods high in solid fats, added sugars, or salt.  Get regular exercise. This is one of the most important things you can do for your health. ? Most adults should exercise for at least 150 minutes each week. The exercise should increase your heart rate and make you sweat (moderate-intensity exercise). ? Most adults should also do strengthening exercises at least twice a week. This is in addition to the moderate-intensity exercise.  Maintain a healthy weight  Body mass index (BMI) is a measurement that can be used to identify possible weight problems. It estimates body fat based on height and weight. Your health care provider can help determine your BMI and help you achieve or maintain a healthy weight.  For females 76 years of age and older: ? A BMI below 18.5 is considered underweight. ? A BMI of 18.5 to 24.9 is normal. ? A BMI of 25 to 29.9 is considered overweight. ? A BMI of 30 and above is considered obese.  Watch levels of cholesterol and blood lipids  You should start having your blood tested for lipids and cholesterol at 30 years of age, then have this test every 5 years.  You may need to have your cholesterol levels checked more often if: ? Your lipid or cholesterol levels are high. ? You are older than 30 years of age. ? You are at high risk for heart disease.  Cancer screening Lung Cancer  Lung cancer  screening is recommended for adults 28-81 years old who are at high risk for lung cancer because of a history of  smoking.  A yearly low-dose CT scan of the lungs is recommended for people who: ? Currently smoke. ? Have quit within the past 15 years. ? Have at least a 30-pack-year history of smoking. A pack year is smoking an average of one pack of cigarettes a day for 1 year.  Yearly screening should continue until it has been 15 years since you quit.  Yearly screening should stop if you develop a health problem that would prevent you from having lung cancer treatment.  Breast Cancer  Practice breast self-awareness. This means understanding how your breasts normally appear and feel.  It also means doing regular breast self-exams. Let your health care provider know about any changes, no matter how small.  If you are in your 20s or 30s, you should have a clinical breast exam (CBE) by a health care provider every 1-3 years as part of a regular health exam.  If you are 69 or older, have a CBE every year. Also consider having a breast X-ray (mammogram) every year.  If you have a family history of breast cancer, talk to your health care provider about genetic screening.  If you are at high risk for breast cancer, talk to your health care provider about having an MRI and a mammogram every year.  Breast cancer gene (BRCA) assessment is recommended for women who have family members with BRCA-related cancers. BRCA-related cancers include: ? Breast. ? Ovarian. ? Tubal. ? Peritoneal cancers.  Results of the assessment will determine the need for genetic counseling and BRCA1 and BRCA2 testing.  Cervical Cancer Your health care provider may recommend that you be screened regularly for cancer of the pelvic organs (ovaries, uterus, and vagina). This screening involves a pelvic examination, including checking for microscopic changes to the surface of your cervix (Pap test). You may be encouraged to have this screening done every 3 years, beginning at age 106.  For women ages 19-65, health care providers may  recommend pelvic exams and Pap testing every 3 years, or they may recommend the Pap and pelvic exam, combined with testing for human papilloma virus (HPV), every 5 years. Some types of HPV increase your risk of cervical cancer. Testing for HPV may also be done on women of any age with unclear Pap test results.  Other health care providers may not recommend any screening for nonpregnant women who are considered low risk for pelvic cancer and who do not have symptoms. Ask your health care provider if a screening pelvic exam is right for you.  If you have had past treatment for cervical cancer or a condition that could lead to cancer, you need Pap tests and screening for cancer for at least 20 years after your treatment. If Pap tests have been discontinued, your risk factors (such as having a new sexual partner) need to be reassessed to determine if screening should resume. Some women have medical problems that increase the chance of getting cervical cancer. In these cases, your health care provider may recommend more frequent screening and Pap tests.  Colorectal Cancer  This type of cancer can be detected and often prevented.  Routine colorectal cancer screening usually begins at 30 years of age and continues through 30 years of age.  Your health care provider may recommend screening at an earlier age if you have risk factors for colon cancer.  Your health care  provider may also recommend using home test kits to check for hidden blood in the stool.  A small camera at the end of a tube can be used to examine your colon directly (sigmoidoscopy or colonoscopy). This is done to check for the earliest forms of colorectal cancer.  Routine screening usually begins at age 36.  Direct examination of the colon should be repeated every 5-10 years through 30 years of age. However, you may need to be screened more often if early forms of precancerous polyps or small growths are found.  Skin Cancer  Check  your skin from head to toe regularly.  Tell your health care provider about any new moles or changes in moles, especially if there is a change in a mole's shape or color.  Also tell your health care provider if you have a mole that is larger than the size of a pencil eraser.  Always use sunscreen. Apply sunscreen liberally and repeatedly throughout the day.  Protect yourself by wearing long sleeves, pants, a wide-brimmed hat, and sunglasses whenever you are outside.  Heart disease, diabetes, and high blood pressure  High blood pressure causes heart disease and increases the risk of stroke. High blood pressure is more likely to develop in: ? People who have blood pressure in the high end of the normal range (130-139/85-89 mm Hg). ? People who are overweight or obese. ? People who are African American.  If you are 73-77 years of age, have your blood pressure checked every 3-5 years. If you are 66 years of age or older, have your blood pressure checked every year. You should have your blood pressure measured twice-once when you are at a hospital or clinic, and once when you are not at a hospital or clinic. Record the average of the two measurements. To check your blood pressure when you are not at a hospital or clinic, you can use: ? An automated blood pressure machine at a pharmacy. ? A home blood pressure monitor.  If you are between 22 years and 9 years old, ask your health care provider if you should take aspirin to prevent strokes.  Have regular diabetes screenings. This involves taking a blood sample to check your fasting blood sugar level. ? If you are at a normal weight and have a low risk for diabetes, have this test once every three years after 30 years of age. ? If you are overweight and have a high risk for diabetes, consider being tested at a younger age or more often. Preventing infection Hepatitis B  If you have a higher risk for hepatitis B, you should be screened for this  virus. You are considered at high risk for hepatitis B if: ? You were born in a country where hepatitis B is common. Ask your health care provider which countries are considered high risk. ? Your parents were born in a high-risk country, and you have not been immunized against hepatitis B (hepatitis B vaccine). ? You have HIV or AIDS. ? You use needles to inject street drugs. ? You live with someone who has hepatitis B. ? You have had sex with someone who has hepatitis B. ? You get hemodialysis treatment. ? You take certain medicines for conditions, including cancer, organ transplantation, and autoimmune conditions.  Hepatitis C  Blood testing is recommended for: ? Everyone born from [birth year] through 1963. ? Anyone with known risk factors for hepatitis C.  Sexually transmitted infections (STIs)  You should be screened for sexually transmitted  infections (STIs) including gonorrhea and chlamydia if: ? You are sexually active and are younger than 30 years of age. ? You are older than 30 years of age and your health care provider tells you that you are at risk for this type of infection. ? Your sexual activity has changed since you were last screened and you are at an increased risk for chlamydia or gonorrhea. Ask your health care provider if you are at risk.  If you do not have HIV, but are at risk, it may be recommended that you take a prescription medicine daily to prevent HIV infection. This is called pre-exposure prophylaxis (PrEP). You are considered at risk if: ? You are sexually active and do not regularly use condoms or know the HIV status of your partner(s). ? You take drugs by injection. ? You are sexually active with a partner who has HIV.  Talk with your health care provider about whether you are at high risk of being infected with HIV. If you choose to begin PrEP, you should first be tested for HIV. You should then be tested every 3 months for as long as you are taking  PrEP. Pregnancy  If you are premenopausal and you may become pregnant, ask your health care provider about preconception counseling.  If you may become pregnant, take 400 to 800 micrograms (mcg) of folic acid every day.  If you want to prevent pregnancy, talk to your health care provider about birth control (contraception). Osteoporosis and menopause  Osteoporosis is a disease in which the bones lose minerals and strength with aging. This can result in serious bone fractures. Your risk for osteoporosis can be identified using a bone density scan.  If you are 84 years of age or older, or if you are at risk for osteoporosis and fractures, ask your health care provider if you should be screened.  Ask your health care provider whether you should take a calcium or vitamin D supplement to lower your risk for osteoporosis.  Menopause may have certain physical symptoms and risks.  Hormone replacement therapy may reduce some of these symptoms and risks. Talk to your health care provider about whether hormone replacement therapy is right for you. Follow these instructions at home:  Schedule regular health, dental, and eye exams.  Stay current with your immunizations.  Do not use any tobacco products including cigarettes, chewing tobacco, or electronic cigarettes.  If you are pregnant, do not drink alcohol.  If you are breastfeeding, limit how much and how often you drink alcohol.  Limit alcohol intake to no more than 1 drink per day for nonpregnant women. One drink equals 12 ounces of beer, 5 ounces of wine, or 1 ounces of hard liquor.  Do not use street drugs.  Do not share needles.  Ask your health care provider for help if you need support or information about quitting drugs.  Tell your health care provider if you often feel depressed.  Tell your health care provider if you have ever been abused or do not feel safe at home. This information is not intended to replace advice given  to you by your health care provider. Make sure you discuss any questions you have with your health care provider. Document Released: 06/01/2011 Document Revised: 04/23/2016 Document Reviewed: 08/20/2015 Elsevier Interactive Patient Education  2018 [place]. Jazel Nimmons M.D.

## 2018-07-22 ENCOUNTER — Encounter: Payer: Self-pay | Admitting: Internal Medicine

## 2018-07-22 ENCOUNTER — Ambulatory Visit (INDEPENDENT_AMBULATORY_CARE_PROVIDER_SITE_OTHER): Payer: PRIVATE HEALTH INSURANCE | Admitting: Internal Medicine

## 2018-07-22 VITALS — BP 96/66 | HR 82 | Temp 98.5°F | Ht 65.25 in | Wt 140.2 lb

## 2018-07-22 DIAGNOSIS — Z13220 Encounter for screening for lipoid disorders: Secondary | ICD-10-CM

## 2018-07-22 DIAGNOSIS — Z0000 Encounter for general adult medical examination without abnormal findings: Secondary | ICD-10-CM | POA: Diagnosis not present

## 2018-07-22 DIAGNOSIS — Z3041 Encounter for surveillance of contraceptive pills: Secondary | ICD-10-CM

## 2018-07-22 DIAGNOSIS — F988 Other specified behavioral and emotional disorders with onset usually occurring in childhood and adolescence: Secondary | ICD-10-CM

## 2018-07-22 DIAGNOSIS — Z114 Encounter for screening for human immunodeficiency virus [HIV]: Secondary | ICD-10-CM

## 2018-07-22 DIAGNOSIS — Z79899 Other long term (current) drug therapy: Secondary | ICD-10-CM

## 2018-07-22 HISTORY — DX: Encounter for surveillance of contraceptive pills: Z30.41

## 2018-07-22 LAB — CBC WITH DIFFERENTIAL/PLATELET
BASOS ABS: 0 10*3/uL (ref 0.0–0.1)
BASOS PCT: 0.9 % (ref 0.0–3.0)
EOS ABS: 0 10*3/uL (ref 0.0–0.7)
Eosinophils Relative: 1.1 % (ref 0.0–5.0)
HCT: 39.8 % (ref 36.0–46.0)
Hemoglobin: 13.2 g/dL (ref 12.0–15.0)
LYMPHS ABS: 1.6 10*3/uL (ref 0.7–4.0)
Lymphocytes Relative: 34.5 % (ref 12.0–46.0)
MCHC: 33.3 g/dL (ref 30.0–36.0)
MCV: 94 fl (ref 78.0–100.0)
MONO ABS: 0.2 10*3/uL (ref 0.1–1.0)
Monocytes Relative: 4.8 % (ref 3.0–12.0)
NEUTROS ABS: 2.7 10*3/uL (ref 1.4–7.7)
NEUTROS PCT: 58.7 % (ref 43.0–77.0)
PLATELETS: 201 10*3/uL (ref 150.0–400.0)
RBC: 4.23 Mil/uL (ref 3.87–5.11)
RDW: 12.4 % (ref 11.5–15.5)
WBC: 4.5 10*3/uL (ref 4.0–10.5)

## 2018-07-22 LAB — HEPATIC FUNCTION PANEL
ALK PHOS: 48 U/L (ref 39–117)
ALT: 25 U/L (ref 0–35)
AST: 16 U/L (ref 0–37)
Albumin: 4.5 g/dL (ref 3.5–5.2)
BILIRUBIN DIRECT: 0.2 mg/dL (ref 0.0–0.3)
TOTAL PROTEIN: 6.8 g/dL (ref 6.0–8.3)
Total Bilirubin: 1.1 mg/dL (ref 0.2–1.2)

## 2018-07-22 LAB — BASIC METABOLIC PANEL
BUN: 12 mg/dL (ref 6–23)
CALCIUM: 9.6 mg/dL (ref 8.4–10.5)
CHLORIDE: 101 meq/L (ref 96–112)
CO2: 29 meq/L (ref 19–32)
CREATININE: 0.72 mg/dL (ref 0.40–1.20)
GFR: 101.08 mL/min (ref >60.00)
Glucose, Bld: 88 mg/dL (ref 70–99)
Potassium: 4.3 mEq/L (ref 3.5–5.1)
Sodium: 136 mEq/L (ref 135–145)

## 2018-07-22 LAB — LIPID PANEL
CHOL/HDL RATIO: 3
Cholesterol: 147 mg/dL (ref 0–200)
HDL: 56.6 mg/dL (ref >39.00)
LDL Cholesterol: 81 mg/dL (ref 0–99)
NONHDL: 90.17
Triglycerides: 46 mg/dL (ref 0.0–149.0)
VLDL: 9.2 mg/dL (ref 0.0–40.0)

## 2018-07-22 LAB — TSH: TSH: 1.11 u[IU]/mL (ref 0.35–4.50)

## 2018-07-22 MED ORDER — NORETHINDRONE ACET-ETHINYL EST 1.5-30 MG-MCG PO TABS: 1.0000 | ORAL_TABLET | Freq: Every day | ORAL | 3 refills | Status: DC

## 2018-07-22 NOTE — Patient Instructions (Signed)
Continue lifestyle intervention healthy eating and exercise .  contact us ofr refills adderall   ROV in about 6 mponths med check   Health Maintenance, Female Adopting a healthy lifestyle and getting preventive care can go a long way to promote health and wellness. Talk with your health care provider about what schedule of regular examinations is right for you. This is a good chance for you to check in with your provider about disease prevention and staying healthy. In between checkups, there are plenty of things you can do on your own. Experts have done a lot of research about which lifestyle changes and preventive measures are most likely to keep you healthy. Ask your health care provider for more information. Weight and diet Eat a healthy diet  Be sure to include plenty of vegetables, fruits, low-fat dairy products, and lean protein.  Do not eat a lot of foods high in solid fats, added sugars, or salt.  Get regular exercise. This is one of the most important things you can do for your health. ? Most adults should exercise for at least 150 minutes each week. The exercise should increase your heart rate and make you sweat (moderate-intensity exercise). ? Most adults should also do strengthening exercises at least twice a week. This is in addition to the moderate-intensity exercise.  Maintain a healthy weight  Body mass index (BMI) is a measurement that can be used to identify possible weight problems. It estimates body fat based on height and weight. Your health care provider can help determine your BMI and help you achieve or maintain a healthy weight.  For females 49 years of age and older: ? A BMI below 18.5 is considered underweight. ? A BMI of 18.5 to 24.9 is normal. ? A BMI of 25 to 29.9 is considered overweight. ? A BMI of 30 and above is considered obese.  Watch levels of cholesterol and blood lipids  You should start having your blood tested for lipids and cholesterol at 30  years of age, then have this test every 5 years.  You may need to have your cholesterol levels checked more often if: ? Your lipid or cholesterol levels are high. ? You are older than 30 years of age. ? You are at high risk for heart disease.  Cancer screening Lung Cancer  Lung cancer screening is recommended for adults 60-41 years old who are at high risk for lung cancer because of a history of smoking.  A yearly low-dose CT scan of the lungs is recommended for people who: ? Currently smoke. ? Have quit within the past 15 years. ? Have at least a 30-pack-year history of smoking. A pack year is smoking an average of one pack of cigarettes a day for 1 year.  Yearly screening should continue until it has been 15 years since you quit.  Yearly screening should stop if you develop a health problem that would prevent you from having lung cancer treatment.  Breast Cancer  Practice breast self-awareness. This means understanding how your breasts normally appear and feel.  It also means doing regular breast self-exams. Let your health care provider know about any changes, no matter how small.  If you are in your 20s or 30s, you should have a clinical breast exam (CBE) by a health care provider every 1-3 years as part of a regular health exam.  If you are 61 or older, have a CBE every year. Also consider having a breast X-ray (mammogram) every year.  If you have a family history of breast cancer, talk to your health care provider about genetic screening.  If you are at high risk for breast cancer, talk to your health care provider about having an MRI and a mammogram every year.  Breast cancer gene (BRCA) assessment is recommended for women who have family members with BRCA-related cancers. BRCA-related cancers include: ? Breast. ? Ovarian. ? Tubal. ? Peritoneal cancers.  Results of the assessment will determine the need for genetic counseling and BRCA1 and BRCA2 testing.  Cervical  Cancer Your health care provider may recommend that you be screened regularly for cancer of the pelvic organs (ovaries, uterus, and vagina). This screening involves a pelvic examination, including checking for microscopic changes to the surface of your cervix (Pap test). You may be encouraged to have this screening done every 3 years, beginning at age 28.  For women ages 42-65, health care providers may recommend pelvic exams and Pap testing every 3 years, or they may recommend the Pap and pelvic exam, combined with testing for human papilloma virus (HPV), every 5 years. Some types of HPV increase your risk of cervical cancer. Testing for HPV may also be done on women of any age with unclear Pap test results.  Other health care providers may not recommend any screening for nonpregnant women who are considered low risk for pelvic cancer and who do not have symptoms. Ask your health care provider if a screening pelvic exam is right for you.  If you have had past treatment for cervical cancer or a condition that could lead to cancer, you need Pap tests and screening for cancer for at least 20 years after your treatment. If Pap tests have been discontinued, your risk factors (such as having a new sexual partner) need to be reassessed to determine if screening should resume. Some women have medical problems that increase the chance of getting cervical cancer. In these cases, your health care provider may recommend more frequent screening and Pap tests.  Colorectal Cancer  This type of cancer can be detected and often prevented.  Routine colorectal cancer screening usually begins at 30 years of age and continues through 30 years of age.  Your health care provider may recommend screening at an earlier age if you have risk factors for colon cancer.  Your health care provider may also recommend using home test kits to check for hidden blood in the stool.  A small camera at the end of a tube can be used to  examine your colon directly (sigmoidoscopy or colonoscopy). This is done to check for the earliest forms of colorectal cancer.  Routine screening usually begins at age 21.  Direct examination of the colon should be repeated every 5-10 years through 30 years of age. However, you may need to be screened more often if early forms of precancerous polyps or small growths are found.  Skin Cancer  Check your skin from head to toe regularly.  Tell your health care provider about any new moles or changes in moles, especially if there is a change in a mole's shape or color.  Also tell your health care provider if you have a mole that is larger than the size of a pencil eraser.  Always use sunscreen. Apply sunscreen liberally and repeatedly throughout the day.  Protect yourself by wearing long sleeves, pants, a wide-brimmed hat, and sunglasses whenever you are outside.  Heart disease, diabetes, and high blood pressure  High blood pressure causes heart disease and  increases the risk of stroke. High blood pressure is more likely to develop in: ? People who have blood pressure in the high end of the normal range (130-139/85-89 mm Hg). ? People who are overweight or obese. ? People who are African American.  If you are 73-54 years of age, have your blood pressure checked every 3-5 years. If you are 23 years of age or older, have your blood pressure checked every year. You should have your blood pressure measured twice-once when you are at a hospital or clinic, and once when you are not at a hospital or clinic. Record the average of the two measurements. To check your blood pressure when you are not at a hospital or clinic, you can use: ? An automated blood pressure machine at a pharmacy. ? A home blood pressure monitor.  If you are between 67 years and 20 years old, ask your health care provider if you should take aspirin to prevent strokes.  Have regular diabetes screenings. This involves taking a  blood sample to check your fasting blood sugar level. ? If you are at a normal weight and have a low risk for diabetes, have this test once every three years after 30 years of age. ? If you are overweight and have a high risk for diabetes, consider being tested at a younger age or more often. Preventing infection Hepatitis B  If you have a higher risk for hepatitis B, you should be screened for this virus. You are considered at high risk for hepatitis B if: ? You were born in a country where hepatitis B is common. Ask your health care provider which countries are considered high risk. ? Your parents were born in a high-risk country, and you have not been immunized against hepatitis B (hepatitis B vaccine). ? You have HIV or AIDS. ? You use needles to inject street drugs. ? You live with someone who has hepatitis B. ? You have had sex with someone who has hepatitis B. ? You get hemodialysis treatment. ? You take certain medicines for conditions, including cancer, organ transplantation, and autoimmune conditions.  Hepatitis C  Blood testing is recommended for: ? Everyone born from [birth year] through 1963. ? Anyone with known risk factors for hepatitis C.  Sexually transmitted infections (STIs)  You should be screened for sexually transmitted infections (STIs) including gonorrhea and chlamydia if: ? You are sexually active and are younger than 30 years of age. ? You are older than 30 years of age and your health care provider tells you that you are at risk for this type of infection. ? Your sexual activity has changed since you were last screened and you are at an increased risk for chlamydia or gonorrhea. Ask your health care provider if you are at risk.  If you do not have HIV, but are at risk, it may be recommended that you take a prescription medicine daily to prevent HIV infection. This is called pre-exposure prophylaxis (PrEP). You are considered at risk if: ? You are sexually active and  do not regularly use condoms or know the HIV status of your partner(s). ? You take drugs by injection. ? You are sexually active with a partner who has HIV.  Talk with your health care provider about whether you are at high risk of being infected with HIV. If you choose to begin PrEP, you should first be tested for HIV. You should then be tested every 3 months for as long as you are taking  PrEP. Pregnancy  If you are premenopausal and you may become pregnant, ask your health care provider about preconception counseling.  If you may become pregnant, take 400 to 800 micrograms (mcg) of folic acid every day.  If you want to prevent pregnancy, talk to your health care provider about birth control (contraception). Osteoporosis and menopause  Osteoporosis is a disease in which the bones lose minerals and strength with aging. This can result in serious bone fractures. Your risk for osteoporosis can be identified using a bone density scan.  If you are 45 years of age or older, or if you are at risk for osteoporosis and fractures, ask your health care provider if you should be screened.  Ask your health care provider whether you should take a calcium or vitamin D supplement to lower your risk for osteoporosis.  Menopause may have certain physical symptoms and risks.  Hormone replacement therapy may reduce some of these symptoms and risks. Talk to your health care provider about whether hormone replacement therapy is right for you. Follow these instructions at home:  Schedule regular health, dental, and eye exams.  Stay current with your immunizations.  Do not use any tobacco products including cigarettes, chewing tobacco, or electronic cigarettes.  If you are pregnant, do not drink alcohol.  If you are breastfeeding, limit how much and how often you drink alcohol.  Limit alcohol intake to no more than 1 drink per day for nonpregnant women. One drink equals 12 ounces of beer, 5 ounces of  wine, or 1 ounces of hard liquor.  Do not use street drugs.  Do not share needles.  Ask your health care provider for help if you need support or information about quitting drugs.  Tell your health care provider if you often feel depressed.  Tell your health care provider if you have ever been abused or do not feel safe at home. This information is not intended to replace advice given to you by your health care provider. Make sure you discuss any questions you have with your health care provider. Document Released: 06/01/2011 Document Revised: 04/23/2016 Document Reviewed: 08/20/2015 Elsevier Interactive Patient Education  [employer].

## 2018-07-23 LAB — HIV ANTIBODY (ROUTINE TESTING W REFLEX): HIV: NONREACTIVE

## 2018-07-25 ENCOUNTER — Encounter: Payer: Self-pay | Admitting: *Deleted

## 2018-09-11 ENCOUNTER — Other Ambulatory Visit: Payer: Self-pay

## 2018-09-12 NOTE — Telephone Encounter (Signed)
Last OV 07/22/18 Last refill 06/10/18 #30 x 3rx's Upcoming OV 01/24/2019  Please advise Dr Clent Ridges is able to refill Rx in Dr Rosezella [place] absence. Rx pending. Thanks.

## 2018-09-13 ENCOUNTER — Other Ambulatory Visit: Payer: Self-pay

## 2018-09-13 MED ORDER — AMPHETAMINE-DEXTROAMPHET ER 20 MG PO CP24: 20.0000 mg | ORAL_CAPSULE | ORAL | 0 refills | Status: DC

## 2018-09-13 NOTE — Telephone Encounter (Signed)
DUPLICATE REQUEST

## 2018-09-13 NOTE — Telephone Encounter (Signed)
Done

## 2018-10-13 ENCOUNTER — Other Ambulatory Visit: Payer: Self-pay | Admitting: Family Medicine

## 2018-10-13 ENCOUNTER — Other Ambulatory Visit: Payer: Self-pay

## 2018-10-14 MED ORDER — AMPHETAMINE-DEXTROAMPHET ER 20 MG PO CP24: 20.0000 mg | ORAL_CAPSULE | ORAL | 0 refills | Status: DC

## 2018-10-14 MED ORDER — AMPHETAMINE-DEXTROAMPHET ER 20 MG PO CP24: 20.0000 mg | ORAL_CAPSULE | Freq: Every day | ORAL | 0 refills | Status: DC

## 2018-10-14 NOTE — Telephone Encounter (Signed)
Last refilled 06/10/18 #30 x 2 Rx's Filled again on 09/13/18 #30 x 0  Last OV 07/22/18  Please advise Dr Fabian SharpPanosh, thanks.

## 2018-10-14 NOTE — Telephone Encounter (Signed)
Sent in electronically .  

## 2018-12-14 ENCOUNTER — Other Ambulatory Visit: Payer: Self-pay

## 2018-12-14 NOTE — Telephone Encounter (Signed)
Please advise  Last OV:07/22/18 Last filled:10/14/18

## 2018-12-15 ENCOUNTER — Other Ambulatory Visit: Payer: Self-pay

## 2018-12-15 ENCOUNTER — Other Ambulatory Visit: Payer: Self-pay | Admitting: Family Medicine

## 2018-12-15 NOTE — Telephone Encounter (Signed)
Please confrim which pharmacy  [facility] or  [facility]

## 2018-12-15 NOTE — Telephone Encounter (Signed)
Left Voicemail to confirm pharmacy

## 2018-12-16 MED ORDER — AMPHETAMINE-DEXTROAMPHET ER 20 MG PO CP24: 20.0000 mg | ORAL_CAPSULE | ORAL | 0 refills | Status: DC

## 2018-12-16 NOTE — Telephone Encounter (Signed)
Sent in electronically .  

## 2018-12-16 NOTE — Telephone Encounter (Signed)
Pt would like it sent to [facility] in [place]

## 2019-01-17 ENCOUNTER — Other Ambulatory Visit: Payer: Self-pay | Admitting: Family Medicine

## 2019-01-17 ENCOUNTER — Other Ambulatory Visit: Payer: Self-pay

## 2019-01-17 MED ORDER — AMPHETAMINE-DEXTROAMPHET ER 20 MG PO CP24: 20.0000 mg | ORAL_CAPSULE | ORAL | 0 refills | Status: DC

## 2019-01-17 MED ORDER — AMPHETAMINE-DEXTROAMPHET ER 20 MG PO CP24: 20.0000 mg | ORAL_CAPSULE | Freq: Every day | ORAL | 0 refills | Status: DC

## 2019-01-17 NOTE — Telephone Encounter (Signed)
Last filled:10/14/18 Last OV:07/22/18 Upcoming appt:01/24/2019

## 2019-01-17 NOTE — Telephone Encounter (Signed)
Sent in electronically . Has upcoming appt. 

## 2019-01-23 NOTE — Progress Notes (Signed)
Chief Complaint  Patient presents with  . Follow-up    Med check on Adderall. Pt has no concerns today     HPI: Barbara DareJulie D Cuevas 33 y.o. come in for men\d management   Comes in for follow up of  adhd add:   . Since last visit : Taking medication: Every day Helps her work environment and still helps her focus.  Negative TD occasional alcohol social. She does feel that acne flare since she has been on the Adderall.  She is on birth control pills for acne and contraception had been on topical retinal long time ago fell to the did not help and had been given antibiotics doxycycline but only took it about 10 days without help. She states that she breaks out on her face and jawline area does not seem to associated with her periods or any cycle and she attributes it to starting when she started the Adderall.: Sleep : 7    38 hours  Diet : ok Mood :ok Medication assessment :No significant side effects such as major sleep issues and mood changes, chest pain, shortness of breath, headaches , GI or significant weight loss.  ROS: See pertinent positives and negatives per HPI.  No chest pain shortness of breath unusual bleeding change in body hair.  Past Medical History:  Diagnosis Date  . Hx of varicella     Family History  Problem Relation Age of Onset  . Heart disease Father [id number]       Open-heart surgery  . Diabetes type II Unknown        Parent,Grandparent  . Arthritis Unknown        Parent  . ADD / ADHD Brother     Social History   Socioeconomic History  . Marital status: Single    Spouse name: Not on file  . Number of children: Not on file  . Years of education: Not on file  . Highest education level: Not on file  Occupational History  . Not on file  Social Needs  . Financial resource strain: Not on file  . Food insecurity:    Worry: Not on file    Inability: Not on file  . Transportation needs:    Medical: Not on file    Non-medical: Not on file  Tobacco Use  .  Smoking status: Never Smoker  . Smokeless tobacco: Never Used  Substance and Sexual Activity  . Alcohol use: Yes    Comment: socially  . Drug use: No  . Sexual activity: Not on file  Lifestyle  . Physical activity:    Days per week: Not on file    Minutes per session: Not on file  . Stress: Not on file  Relationships  . Social connections:    Talks on phone: Not on file    Gets together: Not on file    Attends religious service: Not on file    Active member of club or organization: Not on file    Attends meetings of clubs or organizations: Not on file    Relationship status: Not on file  Other Topics Concern  . 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    Outpatient Medications Prior to Visit  Medication Sig Dispense Refill  . amphetamine-dextroamphetamine (ADDERALL XR) 20 MG 24 hr capsule Take 1 capsule (20 mg total) by mouth daily. 30 capsule 0  . amphetamine-dextroamphetamine (ADDERALL XR) 20 MG 24 hr capsule Take 1 capsule (20 mg total) by mouth every morning. 30 capsule 0  . Norethindrone Acetate-Ethinyl Estradiol (MICROGESTIN) 1.5-30 MG-MCG tablet Take 1 tablet by mouth daily. For 21 days off for 7 days 63 tablet 3  . amphetamine-dextroamphetamine (ADDERALL XR) 20 MG 24 hr capsule Take 1 capsule (20 mg total) by mouth every morning. 30 capsule 0   No facility-administered medications prior to visit.      EXAM:  BP 114/72 (BP Location: Right Arm, Patient Position: Sitting, Cuff Size: Normal)   Pulse 95   Temp 98.1 F (36.7 C) (Oral)   Ht 5\' 5"  (1.651 m)   Wt 147 lb 3.2 oz (66.8 kg)   LMP 01/24/2019 (Exact Date)   BMI 24.50 kg/m   Body mass index is 24.5 kg/m.  GENERAL: vitals reviewed  and listed above, alert, oriented, appears well hydrated and in no acute distress HEENT: atraumatic, conjunctiva  clear, no obvious abnormalities on inspection of external nose and ears  NECK: no obvious masses on inspection palpation  Abdomen soft without organomegaly guarding or rebound CV: HRRR, no clubbing cyanosis or  peripheral edema nl cap refill  MS: moves all extremities without noticeable focal  Abnormality Skin has make-up on but inflammatory acne on cheeks jawline minimal on forehead and nose.  Some scarring anterior chest is clear.  No unusual body hair history of. PSYCH: pleasant and cooperative, no obvious depression or anxiety Lab Results  Component Value Date   WBC 4.5 07/22/2018   HGB 13.2 07/22/2018   HCT 39.8 07/22/2018   PLT 201.0 07/22/2018   GLUCOSE 88 07/22/2018   CHOL 147 07/22/2018   TRIG 46.0 07/22/2018   HDL 56.60 07/22/2018   LDLCALC 81 07/22/2018   ALT 25 07/22/2018   AST 16 07/22/2018   NA 136 07/22/2018   K 4.3 07/22/2018   CL 101 07/22/2018   CREATININE 0.72 07/22/2018   BUN 12 07/22/2018   CO2 29 07/22/2018   TSH 1.11 07/22/2018   HGBA1C 5.2 07/19/2015   BP Readings from Last 3 Encounters:  01/24/19 114/72  07/22/18 96/66  01/20/18 118/70    ASSESSMENT AND PLAN:  Discussed the following assessment and plan:  Attention deficit disorder, unspecified hyperactivity presence - benefit still ? if se acne flare?   Medication management  Acne vulgaris - Moderately severe discussed treatment triple therapy etc. Expectant management change OCPs add back Retin-A cream highest dose may need to upgrade to gel or other products consideration of adding back antibiotics that she never had a reasonable trial. I do not remember her acne being as bad as it is recently although she attributed to the Adderall.  Consideration of dermatologic consult if double or triple therapy is not helpful. Explained the rationale of topical retinoids in addition to the  hormonal suppression.  And health each treatment works differently on the acne.  rov in 2-3 months or earlier if needed  -Patient advised to return or notify health care team  if  new concerns arise.  Patient Instructions  Changing  ocp for acne    Please add back  Retina  At night    Gel may be more effective  but more irritating   Need to use  meds fot at least 8 weeks before seeing help  For ocps 3 months .   Ok to continue adderall for now /.   Consideration of antibiotic also for longer time.    Neta Mends. Mady Oubre M.D.

## 2019-01-24 ENCOUNTER — Ambulatory Visit: Payer: PRIVATE HEALTH INSURANCE | Admitting: Internal Medicine

## 2019-01-24 ENCOUNTER — Encounter: Payer: Self-pay | Admitting: Internal Medicine

## 2019-01-24 VITALS — BP 114/72 | HR 95 | Temp 98.1°F | Ht 65.0 in | Wt 147.2 lb

## 2019-01-24 DIAGNOSIS — Z79899 Other long term (current) drug therapy: Secondary | ICD-10-CM

## 2019-01-24 DIAGNOSIS — F988 Other specified behavioral and emotional disorders with onset usually occurring in childhood and adolescence: Secondary | ICD-10-CM

## 2019-01-24 DIAGNOSIS — L700 Acne vulgaris: Secondary | ICD-10-CM | POA: Diagnosis not present

## 2019-01-24 MED ORDER — DROSPIRENONE-ETHINYL ESTRADIOL 3-0.03 MG PO TABS: 1.0000 | ORAL_TABLET | Freq: Every day | ORAL | 3 refills | Status: DC

## 2019-01-24 MED ORDER — TRETINOIN 0.1 % EX CREA: TOPICAL_CREAM | Freq: Every day | CUTANEOUS | 1 refills | Status: DC

## 2019-01-24 NOTE — Patient Instructions (Signed)
Changing  ocp for acne    Please add back  Retina  At night    Gel may be more effective but more irritating   Need to use  meds fot at least 8 weeks before seeing help  For ocps 3 months .   Ok to continue adderall for now /.   Consideration of antibiotic also for longer time.

## 2019-02-24 MED ORDER — DROSPIRENONE-ETHINYL ESTRADIOL 3-0.03 MG PO TABS: 1.0000 | ORAL_TABLET | Freq: Every day | ORAL | 3 refills | Status: DC

## 2019-03-21 ENCOUNTER — Other Ambulatory Visit: Payer: Self-pay

## 2019-03-21 MED ORDER — AMPHETAMINE-DEXTROAMPHET ER 20 MG PO CP24: 20.0000 mg | ORAL_CAPSULE | Freq: Every day | ORAL | 0 refills | Status: DC

## 2019-03-21 NOTE — Telephone Encounter (Signed)
Please advise 

## 2019-03-21 NOTE — Telephone Encounter (Signed)
Sent in electronically .  

## 2019-04-10 ENCOUNTER — Telehealth: Payer: Self-pay

## 2019-04-10 NOTE — Telephone Encounter (Signed)
lvm for pt to call back to change to virtual visit

## 2019-04-10 NOTE — Progress Notes (Signed)
Virtual Visit via Video Note  I connected with@ on 04/11/19 at  9:00 AM EDT by a video enabled telemedicine application and verified that I am speaking with the correct person using two identifiers. Location patient: home Location provider:work office Persons participating in the virtual visit: patient, provider  [facility] national recommendations  regarding COVID 19 pandemic   video visit is advised over in office visit for this patient.  Patient aware  of the limitations of evaluation and management by telemedicine and  availability of in person appointments. and agreed to proceed.   HPI: Barbara Cuevas presents for video visit  For fu acne and change in OCPs from last visit   Now on yasmin  Seems to be better for her acne in addition to  Retina at night .  Still gets out breaks near period but not continuous  .  Periods not as heavy but no concerns.  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 No se of med   Past Medical History:  Diagnosis Date  . Hx of varicella     History reviewed. No pertinent surgical history.  Family History  Problem Relation Age of Onset  . Heart disease Father 3555       Open-heart surgery  . Diabetes type II Unknown        Parent,Grandparent  . Arthritis Unknown        Parent  . ADD / ADHD Brother     Social History   Tobacco Use  . Smoking status: Never Smoker  . Smokeless tobacco: Never Used  Substance Use Topics  . Alcohol use: Yes    Comment: socially  . Drug use: No      Current Outpatient Medications:  .  amphetamine-dextroamphetamine (ADDERALL XR) 20 MG 24 hr capsule, Take 1 capsule (20 mg total) by mouth every morning., Disp: 30 capsule, Rfl: 0 .  amphetamine-dextroamphetamine (ADDERALL XR) 20 MG 24 hr capsule, Take 1 capsule (20 mg total) by mouth every morning., Disp: 30 capsule, Rfl: 0 .  amphetamine-dextroamphetamine (ADDERALL XR) 20 MG 24 hr capsule, Take 1 capsule (20 mg total)  by mouth daily., Disp: 30 capsule, Rfl: 0 .  drospirenone-ethinyl estradiol (YASMIN,ZARAH,SYEDA) 3-0.03 MG tablet, Take 1 tablet by mouth daily., Disp: 1 Package, Rfl: 3 .  tretinoin (RETIN-A) 0.1 % cream, Apply topically at bedtime. For acne, Disp: 45 g, Rfl: 1  EXAM: BP Readings from Last 3 Encounters:  01/24/19 114/72  07/22/18 96/66  01/20/18 118/70    VITALS per patient if applicable:  GENERAL: alert, oriented, appears well and in no acute distress  HEENT: atraumatic, conjunttiva clear, no obvious abnormalities on inspection of external nose and ears Skin looks much clearer on video visit  But no numbered face no obv ppe  nmask marks  NECK: normal movements of the head and neck  LUNGS: on inspection no signs of respiratory distress, breathing rate appears normal, no obvious gross SOB, gasping or wheezing  CV: no obvious cyanosis    PSYCH/NEURO: pleasant and cooperative, no obvious depression or anxiety, speech and thought processing grossly intact Lab Results  Component Value Date   WBC 4.5 07/22/2018   HGB 13.2 07/22/2018   HCT 39.8 07/22/2018   PLT 201.0 07/22/2018   GLUCOSE 88 07/22/2018   CHOL 147 07/22/2018   TRIG 46.0 07/22/2018   HDL 56.60 07/22/2018   LDLCALC 81 07/22/2018   ALT 25 07/22/2018  AST 16 07/22/2018   NA 136 07/22/2018   K 4.3 07/22/2018   CL 101 07/22/2018   CREATININE 0.72 07/22/2018   BUN 12 07/22/2018   CO2 29 07/22/2018   TSH 1.11 07/22/2018   HGBA1C 5.2 07/19/2015    ASSESSMENT AND PLAN:  Discussed the following assessment and plan:  Acne vulgaris - improved on retina and new ocps  continue for now  and reasses at  august cpx med check pharmacy to sendrequest for refills  Medication management  Oral contraceptive pill surveillance  Attention deficit disorder, unspecified hyperactivity presence - refill med to day as will be due refill in nex few weeks   Counseled.   Expectant management and discussion of plan and treatment  with opportunity to ask questions and all were answered. The patient agreed with the plan and demonstrated an understanding of the instructions.   Advised to call back or seek an in-person evaluation if worsening  or having  further concerns . Plan cpx and med checks in August    Can send in message my chart to get scheduled    Berniece Andreas, MD

## 2019-04-11 ENCOUNTER — Encounter: Payer: Self-pay | Admitting: Internal Medicine

## 2019-04-11 ENCOUNTER — Other Ambulatory Visit: Payer: Self-pay

## 2019-04-11 ENCOUNTER — Ambulatory Visit (INDEPENDENT_AMBULATORY_CARE_PROVIDER_SITE_OTHER): Payer: PRIVATE HEALTH INSURANCE | Admitting: Internal Medicine

## 2019-04-11 DIAGNOSIS — Z3041 Encounter for surveillance of contraceptive pills: Secondary | ICD-10-CM | POA: Diagnosis not present

## 2019-04-11 DIAGNOSIS — Z79899 Other long term (current) drug therapy: Secondary | ICD-10-CM

## 2019-04-11 DIAGNOSIS — L700 Acne vulgaris: Secondary | ICD-10-CM

## 2019-04-11 DIAGNOSIS — F988 Other specified behavioral and emotional disorders with onset usually occurring in childhood and adolescence: Secondary | ICD-10-CM | POA: Diagnosis not present

## 2019-04-11 MED ORDER — AMPHETAMINE-DEXTROAMPHET ER 20 MG PO CP24: 20.0000 mg | ORAL_CAPSULE | Freq: Every day | ORAL | 0 refills | Status: DC

## 2019-06-07 ENCOUNTER — Other Ambulatory Visit: Payer: Self-pay | Admitting: Family Medicine

## 2019-06-07 ENCOUNTER — Other Ambulatory Visit: Payer: Self-pay

## 2019-06-07 MED ORDER — AMPHETAMINE-DEXTROAMPHET ER 20 MG PO CP24: 20.0000 mg | ORAL_CAPSULE | Freq: Every day | ORAL | 0 refills | Status: DC

## 2019-06-07 MED ORDER — AMPHETAMINE-DEXTROAMPHET ER 20 MG PO CP24: 20.0000 mg | ORAL_CAPSULE | ORAL | 0 refills | Status: DC

## 2019-08-12 ENCOUNTER — Other Ambulatory Visit: Payer: Self-pay | Admitting: Internal Medicine

## 2019-08-13 ENCOUNTER — Other Ambulatory Visit: Payer: Self-pay | Admitting: Internal Medicine

## 2019-08-16 NOTE — Progress Notes (Signed)
Virtual Visit via Video Note  I connected with@ on 08/16/19 at 10:00 AM EDT by a video enabled telemedicine application and verified that I am speaking with the correct person using two identifiers. Location patient: home Location provider: home office Persons participating in the virtual visit: patient, provider  [place] national recommendations  regarding COVID 19 pandemic   video visit is advised over in office visit for this patient.  Patient aware  of the limitations of evaluation and management by telemedicine and  availability of in person appointments. and agreed to proceed. Has cpx appt in fall has one in October but running out of meds    HPI: Barbara DareJulie D Cuevas presents for video visit   Fu  Acne  On ocps  Bleeding about 3 days seems to help acne   [employer] out of retina   Seems to help skin had to get used to it .   ADHD  adderall  Daily seems still to be helpful   Work 12 hour shifts  3 days per week.     Some moodiness but not serious   Sleep ok 7+ hours  And no other medical issues    ROS: See pertinent positives and negatives per HPI.  Past Medical History:  Diagnosis Date  . Hx of varicella     No past surgical history on file.  Family History  Problem Relation Age of Onset  . Heart disease Father [id number]       Open-heart surgery  . Diabetes type II Unknown        Parent,Grandparent  . Arthritis Unknown        Parent  . ADD / ADHD Brother     Social History   Tobacco Use  . Smoking status: Never Smoker  . Smokeless tobacco: Never Used  Substance Use Topics  . Alcohol use: Yes    Comment: socially  . Drug use: No      Current Outpatient Medications:  .  amphetamine-dextroamphetamine (ADDERALL XR) 20 MG 24 hr capsule, Take 1 capsule (20 mg total) by mouth every morning., Disp: 30 capsule, Rfl: 0 .  amphetamine-dextroamphetamine (ADDERALL XR) 20 MG 24 hr capsule, Take 1 capsule (20 mg total) by mouth every morning., Disp: 30 capsule, Rfl: 0 .   amphetamine-dextroamphetamine (ADDERALL XR) 20 MG 24 hr capsule, Take 1 capsule (20 mg total) by mouth daily., Disp: 30 capsule, Rfl: 0 .  OCELLA 3-0.03 MG tablet, TAKE 1 TABLET BY MOUTH ONCE DAILY, Disp: 28 tablet, Rfl: 0 .  tretinoin (RETIN-A) 0.1 % cream, Apply topically at bedtime. For acne, Disp: 45 g, Rfl: 1  EXAM: BP Readings from Last 3 Encounters:  01/24/19 114/72  07/22/18 96/66  01/20/18 118/70    VITALS per patient if applicable:  GENERAL: alert, oriented, appears well and in no acute distressHEENT: atraumatic, conjunttiva clear, no obvious abnormalities on inspection of external nose and ears  NECK: normal movements of the head and neck  LUNGS: on inspection no signs of respiratory distress, breathing rate appears normal, no obvious gross SOB, gasping or wheezing  CV: no obvious cyanosis  Skin  Faded acne area  on lower face chin   PSYCH/NEURO: pleasant and cooperative, no obvious depression or anxiety, speech and thought processing grossly intact Lab Results  Component Value Date   WBC 4.5 07/22/2018   HGB 13.2 07/22/2018   HCT 39.8 07/22/2018   PLT 201.0 07/22/2018   GLUCOSE 88 07/22/2018   CHOL 147 07/22/2018   TRIG 46.0  07/22/2018   HDL 56.60 07/22/2018   LDLCALC 81 07/22/2018   ALT 25 07/22/2018   AST 16 07/22/2018   NA 136 07/22/2018   K 4.3 07/22/2018   CL 101 07/22/2018   CREATININE 0.72 07/22/2018   BUN 12 07/22/2018   CO2 29 07/22/2018   TSH 1.11 07/22/2018   HGBA1C 5.2 07/19/2015    ASSESSMENT AND PLAN:  Discussed the following assessment and plan:    ICD-10-CM   1. Medication management  Z79.899   2. Acne vulgaris  L70.0   3. Oral contraceptive pill surveillance  Z30.41   4. Attention deficit disorder, unspecified hyperactivity presence  F98.8    Will send in   3 x 30 adderall as has been doing well on this at this time .  Acne use of ocps and  Add retina back  Every other or daily as tolerated   Keep appt for her cpx in October and  then back to 6 mos  Checks    Counseled.   Expectant management and discussion of plan and treatment with opportunity to ask questions and all were answered. The patient agreed with the plan and demonstrated an understanding of the instructions.   Get back in interim if problems   Shanon Ace, MD

## 2019-08-16 NOTE — Telephone Encounter (Signed)
Tried calling pt as requested no answer

## 2019-08-17 ENCOUNTER — Telehealth (INDEPENDENT_AMBULATORY_CARE_PROVIDER_SITE_OTHER): Payer: PRIVATE HEALTH INSURANCE | Admitting: Internal Medicine

## 2019-08-17 ENCOUNTER — Encounter: Payer: Self-pay | Admitting: Internal Medicine

## 2019-08-17 ENCOUNTER — Other Ambulatory Visit: Payer: Self-pay

## 2019-08-17 DIAGNOSIS — F988 Other specified behavioral and emotional disorders with onset usually occurring in childhood and adolescence: Secondary | ICD-10-CM | POA: Diagnosis not present

## 2019-08-17 DIAGNOSIS — L700 Acne vulgaris: Secondary | ICD-10-CM | POA: Diagnosis not present

## 2019-08-17 DIAGNOSIS — Z3041 Encounter for surveillance of contraceptive pills: Secondary | ICD-10-CM | POA: Diagnosis not present

## 2019-08-17 DIAGNOSIS — Z79899 Other long term (current) drug therapy: Secondary | ICD-10-CM

## 2019-08-17 MED ORDER — DROSPIRENONE-ETHINYL ESTRADIOL 3-0.03 MG PO TABS: 1.0000 | ORAL_TABLET | Freq: Every day | ORAL | 1 refills | Status: DC

## 2019-08-17 MED ORDER — AMPHETAMINE-DEXTROAMPHET ER 20 MG PO CP24: 20.0000 mg | ORAL_CAPSULE | Freq: Every day | ORAL | 0 refills | Status: DC

## 2019-08-17 MED ORDER — TRETINOIN 0.1 % EX CREA: TOPICAL_CREAM | Freq: Every day | CUTANEOUS | 2 refills | Status: AC

## 2019-08-17 MED ORDER — AMPHETAMINE-DEXTROAMPHET ER 20 MG PO CP24: 20.0000 mg | ORAL_CAPSULE | ORAL | 0 refills | Status: DC

## 2019-08-25 NOTE — Progress Notes (Signed)
Correction of  Date of service was  9 /17/20  At 10 am  And not the 9 16 the date the record was opened .  WP

## 2019-10-02 NOTE — Progress Notes (Signed)
Chief Complaint  Patient presents with  . Annual Exam    no concerns     HPI: Patient  Barbara Cuevas  33 y.o. comes in today for Preventive Health Care visit   Reg menses  .   Now  Acne ok old pigment scaring scarring used retina not using all the time  Moisturizer  Helps .  Check dark spot left jaw line.   adhd no change on meds   Mom had hosp for covid with resp failruure    Health Maintenance  Topic Date Due  . PAP SMEAR-Modifier  07/20/2020  . TETANUS/TDAP  03/24/2021  . INFLUENZA VACCINE  Completed  . HIV Screening  Completed   Health Maintenance Review LIFESTYLE:  Exercise:    hospital  Active  Tobacco/ETS: no Alcohol:   ocass Sugar beverages: Sleep: around 7    Drug use: no HH of  Work: 3 days per week days  Father diabled helps  At parents when possible  Active  ROS:  GEN/ HEENT: No fever, significant weight changes sweats headaches vision problems hearing changes, CV/ PULM; No chest pain shortness of breath cough, syncope,edema  change in exercise tolerance. GI /GU: No adominal pain, vomiting, change in bowel habits. No blood in the stool. No significant GU symptoms. SKIN/HEME: ,no acute skin rashes or bleeding. No lymphadenopathy, nodules, masses.  NEURO/ PSYCH:  No neurologic signs such as weakness numbness. No depression anxiety. IMM/ Allergy: No unusual infections.  Allergy .   REST of 12 system review negative except as per HPI   Past Medical History:  Diagnosis Date  . Hx of varicella     History reviewed. No pertinent surgical history.  Family History  Problem Relation Age of Onset  . Heart disease Father 46       Open-heart surgery  . Diabetes type II Unknown        Parent,Grandparent  . Arthritis Unknown        Parent  . ADD / ADHD Brother     Social History   Socioeconomic History  . Marital status: Single    Spouse name: Not on file  . Number of children: Not on file  . Years of education: Not on file  . Highest  education level: Not on file  Occupational History  . Not on file  Social Needs  . Financial resource strain: Not on file  . Food insecurity    Worry: Not on file    Inability: Not on file  . Transportation needs    Medical: Not on file    Non-medical: Not on file  Tobacco Use  . Smoking status: Never Smoker  . Smokeless tobacco: Never Used  Substance and Sexual Activity  . Alcohol use: Yes    Comment: socially  . Drug use: No  . Sexual activity: Not on file  Lifestyle  . Physical activity    Days per week: Not on file    Minutes per session: Not on file  . Stress: Not on file  Relationships  . Social Musician on phone: Not on file    Gets together: Not on file    Attends religious service: Not on file    Active member of club or organization: Not on file    Attends meetings of clubs or organizations: Not on file    Relationship status: Not on file  Other Topics Concern  . Not on file  Social History Narrative  Currently working  [employer] 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    Outpatient Medications Prior to Visit  Medication Sig Dispense Refill  . amphetamine-dextroamphetamine (ADDERALL XR) 20 MG 24 hr capsule Take 1 capsule (20 mg total) by mouth every morning. 30 capsule 0  . amphetamine-dextroamphetamine (ADDERALL XR) 20 MG 24 hr capsule Take 1 capsule (20 mg total) by mouth daily. 30 capsule 0  . drospirenone-ethinyl estradiol (OCELLA) 3-0.03 MG tablet Take 1 tablet by mouth daily. 3 Package 1  . tretinoin (RETIN-A) 0.1 % cream Apply topically at bedtime. For acne 45 g 2  . amphetamine-dextroamphetamine (ADDERALL XR) 20 MG 24 hr capsule Take 1 capsule (20 mg total) by mouth every morning. Fill second 30 capsule 0   No facility-administered  medications prior to visit.      EXAM:  BP 116/68 (BP Location: Right Arm, Patient Position: Sitting, Cuff Size: Normal)   Pulse 95   Temp 98 F (36.7 C) (Temporal)   Ht 5\' 5"  (1.651 m)   Wt 152 lb 3.2 oz (69 kg)   SpO2 98%   BMI 25.33 kg/m   Body mass index is 25.33 kg/m. Wt Readings from Last 3 Encounters:  10/03/19 152 lb 3.2 oz (69 kg)  01/24/19 147 lb 3.2 oz (66.8 kg)  07/22/18 140 lb 3.2 oz (63.6 kg)    Physical Exam: Vital signs reviewed ONG:EXBMGEN:This is a well-developed well-nourished alert cooperative    who appearsr stated age in no acute distress.  HEENT: normocephalic atraumatic , Eyes: PERRL EOM's full, conjunctiva clear,., Ears: no deformity EAC's clear TMs with normal landmarks. OP,masked NECK: supple without masses, thyromegaly or bruits. CHEST/PULM:  Clear to auscultation and percussion breath sounds equal no wheeze , rales or rhonchi. No chest wall deformities or tenderness. Breast: normal by inspection . No dimpling, discharge, masses, tenderness or discharge . CV: PMI is nondisplaced, S1 S2 no gallops, murmurs, rubs. Peripheral pulses are full without delay.No JVD .  ABDOMEN: Bowel sounds normal nontender  No guard or rebound, no hepato splenomegal no CVA tenderness.  No hernia. Extremtities:  No clubbing cyanosis or edema, no acute joint swelling or redness no focal atrophy NEURO:  Oriented x3, cranial nerves 3-12 appear to be intact, no obvious focal weakness,gait within normal limits no abnormal reflexes or asymmetrical SKIN: No acute rashes normal turgor, color, no bruising or petechiae. Left jaw line a tiny 1 mm dark spot  Old acne pigment changes  Otherwise  Cheeks jaw  PSYCH: Oriented, good eye contact, no obvious depression anxiety, cognition and judgment appear normal. LN: no cervical axillary inguinal adenopathy  Lab Results  Component Value Date   WBC 4.5 07/22/2018   HGB 13.2 07/22/2018   HCT 39.8 07/22/2018   PLT 201.0 07/22/2018   GLUCOSE 88  07/22/2018   CHOL 147 07/22/2018   TRIG 46.0 07/22/2018   HDL 56.60 07/22/2018   LDLCALC 81 07/22/2018   ALT 25 07/22/2018   AST 16 07/22/2018   NA 136 07/22/2018   K 4.3 07/22/2018   CL 101 07/22/2018   CREATININE 0.72 07/22/2018   BUN 12 07/22/2018   CO2 29 07/22/2018   TSH 1.11 07/22/2018  HGBA1C 5.2 07/19/2015    BP Readings from Last 3 Encounters:  10/03/19 116/68  01/24/19 114/72  07/22/18 96/66    ASSESSMENT AND PLAN:  Discussed the following assessment and plan:    ICD-10-CM   1. Visit for preventive health examination  Z00.00   2. Medication management  Z79.899   3. Acne vulgaris  L70.0    go back on red retina for now  consider other  optinos explined to use as prevention and not prn   4. Attention deficit disorder, unspecified hyperactivity presence  F98.8   5. Oral contraceptive pill surveillance  Z30.41   6. Skin lesion tiny  L98.9    tiny face but  low threshold follow up if changing getting larger    Return in about 6 months (around 04/01/2020) for medication.  Patient Care Team: Panosh, Standley Brooking, MD as PCP - General (Internal Medicine) Patient Instructions  Try using the retin A every  night for prevention.   Contact us for refills of  Adderall.   ROV in 6 months .  pap smear  Next year     Health Maintenance, Female Adopting a healthy lifestyle and getting preventive care are important in promoting health and wellness. Ask your health care provider about:  The right schedule for you to have regular tests and exams.  Things you can do on your own to prevent diseases and keep yourself healthy. What should I know about diet, weight, and exercise? Eat a healthy diet   Eat a diet that includes plenty of vegetables, fruits, low-fat dairy products, and lean protein.  Do not eat a lot of foods that are high in solid fats, added sugars, or sodium. Maintain a healthy weight Body mass index (BMI) is used to identify weight problems. It estimates  body fat based on height and weight. Your health care provider can help determine your BMI and help you achieve or maintain a healthy weight. Get regular exercise Get regular exercise. This is one of the most important things you can do for your health. Most adults should:  Exercise for at least 150 minutes each week. The exercise should increase your heart rate and make you sweat (moderate-intensity exercise).  Do strengthening exercises at least twice a week. This is in addition to the moderate-intensity exercise.  Spend less time sitting. Even light physical activity can be beneficial. Watch cholesterol and blood lipids Have your blood tested for lipids and cholesterol at 31 years of age, then have this test every 5 years. Have your cholesterol levels checked more often if:  Your lipid or cholesterol levels are high.  You are older than 31 years of age.  You are at high risk for heart disease. What should I know about cancer screening? Depending on your health history and family history, you may need to have cancer screening at various ages. This may include screening for:  Breast cancer.  Cervical cancer.  Colorectal cancer.  Skin cancer.  Lung cancer. What should I know about heart disease, diabetes, and high blood pressure? Blood pressure and heart disease  High blood pressure causes heart disease and increases the risk of stroke. This is more likely to develop in people who have high blood pressure readings, are of African descent, or are overweight.  Have your blood pressure checked: ? Every 3-5 years if you are 21-68 years of age. ? Every year if you are 28 years old or older. Diabetes Have regular diabetes screenings. This checks your fasting  blood sugar level. Have the screening done:  Once every three years after age 61 if you are at a normal weight and have a low risk for diabetes.  More often and at a younger age if you are overweight or have a high risk for  diabetes. What should I know about preventing infection? Hepatitis B If you have a higher risk for hepatitis B, you should be screened for this virus. Talk with your health care provider to find out if you are at risk for hepatitis B infection. Hepatitis C Testing is recommended for:  Everyone born from [birth year] through 1963.  Anyone with known risk factors for hepatitis C. Sexually transmitted infections (STIs)  Get screened for STIs, including gonorrhea and chlamydia, if: ? You are sexually active and are younger than 31 years of age. ? You are older than 31 years of age and your health care provider tells you that you are at risk for this type of infection. ? Your sexual activity has changed since you were last screened, and you are at increased risk for chlamydia or gonorrhea. Ask your health care provider if you are at risk.  Ask your health care provider about whether you are at high risk for HIV. Your health care provider may recommend a prescription medicine to help prevent HIV infection. If you choose to take medicine to prevent HIV, you should first get tested for HIV. You should then be tested every 3 months for as long as you are taking the medicine. Pregnancy  If you are about to stop having your period (premenopausal) and you may become pregnant, seek counseling before you get pregnant.  Take 400 to 800 micrograms (mcg) of folic acid every day if you become pregnant.  Ask for birth control (contraception) if you want to prevent pregnancy. Osteoporosis and menopause Osteoporosis is a disease in which the bones lose minerals and strength with aging. This can result in bone fractures. If you are 80 years old or older, or if you are at risk for osteoporosis and fractures, ask your health care provider if you should:  Be screened for bone loss.  Take a calcium or vitamin D supplement to lower your risk of fractures.  Be given hormone replacement therapy (HRT) to treat symptoms of  menopause. Follow these instructions at home: Lifestyle  Do not use any products that contain nicotine or tobacco, such as cigarettes, e-cigarettes, and chewing tobacco. If you need help quitting, ask your health care provider.  Do not use street drugs.  Do not share needles.  Ask your health care provider for help if you need support or information about quitting drugs. Alcohol use  Do not drink alcohol if: ? Your health care provider tells you not to drink. ? You are pregnant, may be pregnant, or are planning to become pregnant.  If you drink alcohol: ? Limit how much you use to 0-1 drink a day. ? Limit intake if you are breastfeeding.  Be aware of how much alcohol is in your drink. In the [place] one drink equals one 12 oz bottle of beer (355 mL), one 5 oz glass of wine (148 mL), or one 1 oz glass of hard liquor (44 mL). General instructions  Schedule regular health, dental, and eye exams.  Stay current with your vaccines.  Tell your health care provider if: ? You often feel depressed. ? You have ever been abused or do not feel safe at home. Summary  Adopting a healthy lifestyle and  getting preventive care are important in promoting health and wellness.  Follow your health care provider's instructions about healthy diet, exercising, and getting tested or screened for diseases.  Follow your health care provider's instructions on monitoring your cholesterol and blood pressure. This information is not intended to replace advice given to you by your health care provider. Make sure you discuss any questions you have with your health care provider. Document Released: 06/01/2011 Document Revised: 11/09/2018 Document Reviewed: 11/09/2018 Elsevier Patient Education  2020 [employer].    [place] K. Panosh M.D.

## 2019-10-03 ENCOUNTER — Ambulatory Visit (INDEPENDENT_AMBULATORY_CARE_PROVIDER_SITE_OTHER): Payer: PRIVATE HEALTH INSURANCE | Admitting: Internal Medicine

## 2019-10-03 ENCOUNTER — Other Ambulatory Visit: Payer: Self-pay

## 2019-10-03 ENCOUNTER — Telehealth: Payer: Self-pay

## 2019-10-03 ENCOUNTER — Encounter: Payer: Self-pay | Admitting: Internal Medicine

## 2019-10-03 VITALS — BP 116/68 | HR 95 | Temp 98.0°F | Ht 65.0 in | Wt 152.2 lb

## 2019-10-03 DIAGNOSIS — L989 Disorder of the skin and subcutaneous tissue, unspecified: Secondary | ICD-10-CM

## 2019-10-03 DIAGNOSIS — Z79899 Other long term (current) drug therapy: Secondary | ICD-10-CM | POA: Diagnosis not present

## 2019-10-03 DIAGNOSIS — F988 Other specified behavioral and emotional disorders with onset usually occurring in childhood and adolescence: Secondary | ICD-10-CM

## 2019-10-03 DIAGNOSIS — Z0000 Encounter for general adult medical examination without abnormal findings: Secondary | ICD-10-CM

## 2019-10-03 DIAGNOSIS — L700 Acne vulgaris: Secondary | ICD-10-CM | POA: Diagnosis not present

## 2019-10-03 DIAGNOSIS — Z3041 Encounter for surveillance of contraceptive pills: Secondary | ICD-10-CM

## 2019-10-03 NOTE — Telephone Encounter (Signed)
lvm for pt to call back to reschedule her missed appt 10/03/2019 at 8:30 crm created

## 2019-10-03 NOTE — Patient Instructions (Addendum)
Try using the retin A every  night for prevention.   Contact us for refills of  Adderall.   ROV in 6 months .  pap smear  Next year     Health Maintenance, Female Adopting a healthy lifestyle and getting preventive care are important in promoting health and wellness. Ask your health care provider about:  The right schedule for you to have regular tests and exams.  Things you can do on your own to prevent diseases and keep yourself healthy. What should I know about diet, weight, and exercise? Eat a healthy diet   Eat a diet that includes plenty of vegetables, fruits, low-fat dairy products, and lean protein.  Do not eat a lot of foods that are high in solid fats, added sugars, or sodium. Maintain a healthy weight Body mass index (BMI) is used to identify weight problems. It estimates body fat based on height and weight. Your health care provider can help determine your BMI and help you achieve or maintain a healthy weight. Get regular exercise Get regular exercise. This is one of the most important things you can do for your health. Most adults should:  Exercise for at least 150 minutes each week. The exercise should increase your heart rate and make you sweat (moderate-intensity exercise).  Do strengthening exercises at least twice a week. This is in addition to the moderate-intensity exercise.  Spend less time sitting. Even light physical activity can be beneficial. Watch cholesterol and blood lipids Have your blood tested for lipids and cholesterol at 31 years of age, then have this test every 5 years. Have your cholesterol levels checked more often if:  Your lipid or cholesterol levels are high.  You are older than 31 years of age.  You are at high risk for heart disease. What should I know about cancer screening? Depending on your health history and family history, you may need to have cancer screening at various ages. This may include screening for:  Breast  cancer.  Cervical cancer.  Colorectal cancer.  Skin cancer.  Lung cancer. What should I know about heart disease, diabetes, and high blood pressure? Blood pressure and heart disease  High blood pressure causes heart disease and increases the risk of stroke. This is more likely to develop in people who have high blood pressure readings, are of African descent, or are overweight.  Have your blood pressure checked: ? Every 3-5 years if you are 73-85 years of age. ? Every year if you are 56 years old or older. Diabetes Have regular diabetes screenings. This checks your fasting blood sugar level. Have the screening done:  Once every three years after age 63 if you are at a normal weight and have a low risk for diabetes.  More often and at a younger age if you are overweight or have a high risk for diabetes. What should I know about preventing infection? Hepatitis B If you have a higher risk for hepatitis B, you should be screened for this virus. Talk with your health care provider to find out if you are at risk for hepatitis B infection. Hepatitis C Testing is recommended for:  Everyone born from [birth year] through 1963.  Anyone with known risk factors for hepatitis C. Sexually transmitted infections (STIs)  Get screened for STIs, including gonorrhea and chlamydia, if: ? You are sexually active and are younger than 31 years of age. ? You are older than 31 years of age and your health care provider tells you that  you are at risk for this type of infection. ? Your sexual activity has changed since you were last screened, and you are at increased risk for chlamydia or gonorrhea. Ask your health care provider if you are at risk.  Ask your health care provider about whether you are at high risk for HIV. Your health care provider may recommend a prescription medicine to help prevent HIV infection. If you choose to take medicine to prevent HIV, you should first get tested for HIV. You should  then be tested every 3 months for as long as you are taking the medicine. Pregnancy  If you are about to stop having your period (premenopausal) and you may become pregnant, seek counseling before you get pregnant.  Take 400 to 800 micrograms (mcg) of folic acid every day if you become pregnant.  Ask for birth control (contraception) if you want to prevent pregnancy. Osteoporosis and menopause Osteoporosis is a disease in which the bones lose minerals and strength with aging. This can result in bone fractures. If you are 1 years old or older, or if you are at risk for osteoporosis and fractures, ask your health care provider if you should:  Be screened for bone loss.  Take a calcium or vitamin D supplement to lower your risk of fractures.  Be given hormone replacement therapy (HRT) to treat symptoms of menopause. Follow these instructions at home: Lifestyle  Do not use any products that contain nicotine or tobacco, such as cigarettes, e-cigarettes, and chewing tobacco. If you need help quitting, ask your health care provider.  Do not use street drugs.  Do not share needles.  Ask your health care provider for help if you need support or information about quitting drugs. Alcohol use  Do not drink alcohol if: ? Your health care provider tells you not to drink. ? You are pregnant, may be pregnant, or are planning to become pregnant.  If you drink alcohol: ? Limit how much you use to 0-1 drink a day. ? Limit intake if you are breastfeeding.  Be aware of how much alcohol is in your drink. In the [place] one drink equals one 12 oz bottle of beer (355 mL), one 5 oz glass of wine (148 mL), or one 1 oz glass of hard liquor (44 mL). General instructions  Schedule regular health, dental, and eye exams.  Stay current with your vaccines.  Tell your health care provider if: ? You often feel depressed. ? You have ever been abused or do not feel safe at home. Summary  Adopting a  healthy lifestyle and getting preventive care are important in promoting health and wellness.  Follow your health care provider's instructions about healthy diet, exercising, and getting tested or screened for diseases.  Follow your health care provider's instructions on monitoring your cholesterol and blood pressure. This information is not intended to replace advice given to you by your health care provider. Make sure you discuss any questions you have with your health care provider. Document Released: 06/01/2011 Document Revised: 11/09/2018 Document Reviewed: 11/09/2018 Elsevier Patient Education  2020 [employer].

## 2019-11-22 ENCOUNTER — Other Ambulatory Visit: Payer: Self-pay

## 2019-11-22 MED ORDER — AMPHETAMINE-DEXTROAMPHET ER 20 MG PO CP24: 20.0000 mg | ORAL_CAPSULE | ORAL | 0 refills | Status: DC

## 2019-11-22 MED ORDER — AMPHETAMINE-DEXTROAMPHET ER 20 MG PO CP24: 20.0000 mg | ORAL_CAPSULE | Freq: Every day | ORAL | 0 refills | Status: DC

## 2019-11-22 NOTE — Telephone Encounter (Signed)
Last ov:10/03/2019 Last filled:08/17/2019

## 2020-03-04 MED ORDER — DROSPIRENONE-ETHINYL ESTRADIOL 3-0.03 MG PO TABS: 1.0000 | ORAL_TABLET | Freq: Every day | ORAL | 1 refills | Status: DC

## 2020-03-04 MED ORDER — AMPHETAMINE-DEXTROAMPHET ER 20 MG PO CP24: 20.0000 mg | ORAL_CAPSULE | ORAL | 0 refills | Status: DC

## 2020-03-04 MED ORDER — AMPHETAMINE-DEXTROAMPHET ER 20 MG PO CP24: 20.0000 mg | ORAL_CAPSULE | Freq: Every day | ORAL | 0 refills | Status: DC

## 2020-03-04 NOTE — Telephone Encounter (Signed)
Refill sent to [facility]     Please  Inform and or arrange OV  Or virtual needed for med check  before next refill

## 2020-03-05 ENCOUNTER — Other Ambulatory Visit: Payer: Self-pay

## 2020-03-05 MED ORDER — DROSPIRENONE-ETHINYL ESTRADIOL 3-0.03 MG PO TABS: 1.0000 | ORAL_TABLET | Freq: Every day | ORAL | 1 refills | Status: DC

## 2020-04-01 ENCOUNTER — Encounter: Payer: Self-pay | Admitting: Internal Medicine

## 2020-04-01 ENCOUNTER — Telehealth (INDEPENDENT_AMBULATORY_CARE_PROVIDER_SITE_OTHER): Payer: PRIVATE HEALTH INSURANCE | Admitting: Internal Medicine

## 2020-04-01 ENCOUNTER — Other Ambulatory Visit: Payer: Self-pay

## 2020-04-01 VITALS — Ht 65.0 in | Wt 152.0 lb

## 2020-04-01 DIAGNOSIS — L700 Acne vulgaris: Secondary | ICD-10-CM | POA: Diagnosis not present

## 2020-04-01 DIAGNOSIS — F988 Other specified behavioral and emotional disorders with onset usually occurring in childhood and adolescence: Secondary | ICD-10-CM | POA: Diagnosis not present

## 2020-04-01 DIAGNOSIS — Z3041 Encounter for surveillance of contraceptive pills: Secondary | ICD-10-CM | POA: Diagnosis not present

## 2020-04-01 DIAGNOSIS — Z79899 Other long term (current) drug therapy: Secondary | ICD-10-CM

## 2020-04-01 NOTE — Progress Notes (Signed)
Virtual Visit via Video Note  I connected with@ on 04/01/20 at  9:00 AM EDT by a video enabled telemedicine application and verified that I am speaking with the correct person using two identifiers. Location patient: home Location provider:work  office Persons participating in the virtual visit: patient, provider  [place]th national recommendations  regarding COVID 19 pandemic   video visit is advised over in office visit for this patient.  Patient aware  of the limitations of evaluation and management by telemedicine and  availability of in person appointments. and agreed to proceed.   HPI: Barbara Cuevas presents for video visit 6 mos med check   ADHD med  Takes daily no sig se .  Works best for work situation  37-38 hours per week   Neg tob ocass etoh sleep 7 hours    Acne has refills retina   OCPS use periods 3-5 days regular now  Had moderna covid vaccine Last visit 11 20 PPV med check  No new concerns   ROS: See pertinent positives and negatives per HPI.  Past Medical History:  Diagnosis Date  . Hx of varicella     History reviewed. No pertinent surgical history.  Family History  Problem Relation Age of Onset  . Heart disease Father 28       Open-heart surgery  . Diabetes type II Other        Parent,Grandparent  . Arthritis Other        Parent  . ADD / ADHD Brother     Social History   Tobacco Use  . Smoking status: Never Smoker  . Smokeless tobacco: Never Used  Substance Use Topics  . Alcohol use: Yes    Comment: socially  . Drug use: No      Current Outpatient Medications:  .  amphetamine-dextroamphetamine (ADDERALL XR) 20 MG 24 hr capsule, Take 1 capsule (20 mg total) by mouth every morning. Fill second, Disp: 30 capsule, Rfl: 0 .  amphetamine-dextroamphetamine (ADDERALL XR) 20 MG 24 hr capsule, Take 1 capsule (20 mg total) by mouth every morning., Disp: 30 capsule, Rfl: 0 .  amphetamine-dextroamphetamine (ADDERALL XR) 20 MG 24 hr capsule, Take 1  capsule (20 mg total) by mouth daily., Disp: 30 capsule, Rfl: 0 .  drospirenone-ethinyl estradiol (OCELLA) 3-0.03 MG tablet, Take 1 tablet by mouth daily., Disp: 3 Package, Rfl: 1 .  tretinoin (RETIN-A) 0.1 % cream, Apply topically at bedtime. For acne, Disp: 45 g, Rfl: 2  EXAM: BP Readings from Last 3 Encounters:  10/03/19 116/68  01/24/19 114/72  07/22/18 96/66    VITALS per patient if applicable:  GENERAL: alert, oriented, appears well and in no acute distress  HEENT: atraumatic, conjunttiva clear, no obvious abnormalities on inspection of external nose and ears  NECK: normal movements of the head and neck  LUNGS: on inspection no signs of respiratory distress, breathing rate appears normal, no obvious gross SOB, gasping or wheezing  CV: no obvious cyanosis  MS: moves all visible extremities without noticeable abnormality  PSYCH/NEURO: pleasant and cooperative, no obvious depression or anxiety, speech and thought processing grossly intact   ASSESSMENT AND PLAN:  Discussed the following assessment and plan:    ICD-10-CM   1. Attention deficit disorder, unspecified hyperactivity presence  F98.8    benefirt more than risk at this time continue med   2. Medication management  Z79.899   3. Oral contraceptive pill surveillance  Z30.41   4. Acne vulgaris  L70.0   continue same  medications, refill request as needed    Allergies  antihistaminic consider adding incs if needed  Counseled.   Expectant management and discussion of plan and treatment with opportunity to ask questions and all were answered. The patient agreed with the plan and demonstrated an understanding of the instructions.  plan cpx in fall 11 21  Advised to call back or seek an in-person evaluation if worsening  or having  further concerns . Return in about 6 months (around 10/02/2020) for preventive /cpx and medications.    Berniece Andreas, MD

## 2020-06-18 ENCOUNTER — Other Ambulatory Visit: Payer: Self-pay

## 2020-06-18 MED ORDER — AMPHETAMINE-DEXTROAMPHET ER 20 MG PO CP24: 20.0000 mg | ORAL_CAPSULE | ORAL | 0 refills | Status: DC

## 2020-06-18 MED ORDER — AMPHETAMINE-DEXTROAMPHET ER 20 MG PO CP24: 20.0000 mg | ORAL_CAPSULE | Freq: Every day | ORAL | 0 refills | Status: DC

## 2020-06-18 NOTE — Telephone Encounter (Signed)
Last OV 04/01/2020  Last filled 03/04/2020, # 30 with 2 refills

## 2020-09-25 ENCOUNTER — Other Ambulatory Visit: Payer: Self-pay

## 2020-09-27 MED ORDER — AMPHETAMINE-DEXTROAMPHET ER 20 MG PO CP24: 20.0000 mg | ORAL_CAPSULE | ORAL | 0 refills | Status: DC

## 2020-09-27 MED ORDER — AMPHETAMINE-DEXTROAMPHET ER 20 MG PO CP24: 20.0000 mg | ORAL_CAPSULE | Freq: Every day | ORAL | 0 refills | Status: DC

## 2020-10-03 NOTE — Progress Notes (Signed)
Chief Complaint  Patient presents with  . Annual Exam  . Medication Management    HPI: Patient  Barbara Cuevas  34 y.o. comes in today for Preventive Health Care visit   medicine evaluation. Skin cafre routine  Helping her acne  ADHD takes medicine every day does not sure if it is wearing off still helps works 12 to 13-hour shifts Periods are fine wants to stay on same OCP.  No symptoms okay for STI screening blood work wise. She is updated on Covid vaccine and immunizations. Health Maintenance  Topic Date Due  . Hepatitis C Screening  Never done  . PAP SMEAR-Modifier  07/20/2020  . TETANUS/TDAP  03/24/2021  . INFLUENZA VACCINE  Completed  . COVID-19 Vaccine  Completed  . 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 GI /GU: No adominal pain, vomiting, change in bowel habits. No blood in the stool. No significant GU symptoms. SKIN/HEME: ,no acute skin rashes suspicious lesions or bleeding. No lymphadenopathy, nodules, masses.  NEURO/ PSYCH:  No neurologic signs such as weakness numbness. No depression anxiety. IMM/ Allergy: No unusual infections.  Allergy .   REST of 12 system review negative except as per HPI   Past Medical History:  Diagnosis Date  . Hx of varicella     History reviewed. No pertinent surgical history.  Family History  Problem Relation Age of Onset  . Heart disease Father 67       Open-heart surgery  . Diabetes type II Other        Parent,Grandparent  . Arthritis Other        Parent  . ADD / ADHD Brother     Social History   Socioeconomic History  . Marital  status: Single    Spouse name: Not on file  . Number of children: Not on file  . Years of education: Not on file  . Highest education level: Not on file  Occupational History  . Not on file  Tobacco Use  . Smoking status: Never Smoker  . Smokeless tobacco: Never Used  Vaping Use  . Vaping Use: Never used  Substance and Sexual Activity  . Alcohol use: Yes    Comment: socially  . Drug use: No  . Sexual activity: Not on file  Other Topics Concern  . 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   Social Determinants of Health   Financial Resource Strain:   . Difficulty of Paying Living Expenses: Not on file  Food Insecurity:   . Worried About Programme researcher, broadcasting/film/video in the Last Year: Not on file  . Ran Out of Food in the Last Year: Not on file  Transportation Needs:   . Lack of Transportation (Medical): Not  on file  . Lack of Transportation (Non-Medical): Not on file  Physical Activity:   . Days of Exercise per Week: Not on file  . Minutes of Exercise per Session: Not on file  Stress:   . Feeling of Stress : Not on file  Social Connections:   . Frequency of Communication with Friends and Family: Not on file  . Frequency of Social Gatherings with Friends and Family: Not on file  . Attends Religious Services: Not on file  . Active Member of Clubs or Organizations: Not on file  . Attends BankerClub or Organization Meetings: Not on file  . Marital Status: Not on file    Outpatient Medications Prior to Visit  Medication Sig Dispense Refill  . amphetamine-dextroamphetamine (ADDERALL XR) 20 MG 24 hr capsule Take 1 capsule (20 mg total) by mouth every morning. Fill second 30 capsule 0  . amphetamine-dextroamphetamine  (ADDERALL XR) 20 MG 24 hr capsule Take 1 capsule (20 mg total) by mouth every morning. 30 capsule 0  . amphetamine-dextroamphetamine (ADDERALL XR) 20 MG 24 hr capsule Take 1 capsule (20 mg total) by mouth daily. 30 capsule 0  . drospirenone-ethinyl estradiol (OCELLA) 3-0.03 MG tablet Take 1 tablet by mouth daily. 3 Package 1  . tretinoin (RETIN-A) 0.1 % cream Apply topically at bedtime. For acne 45 g 2   No facility-administered medications prior to visit.     EXAM:  BP 116/70   Pulse 96   Temp 98.3 F (36.8 C) (Oral)   Ht 5\' 5"  (1.651 m)   Wt 159 lb 12.8 oz (72.5 kg)   SpO2 98%   BMI 26.59 kg/m   Body mass index is 26.59 kg/m. Wt Readings from Last 3 Encounters:  10/04/20 159 lb 12.8 oz (72.5 kg)  04/01/20 152 lb (68.9 kg)  10/03/19 152 lb 3.2 oz (69 kg)    Physical Exam: Vital signs reviewed TKZ:SWFUGEN:This is a well-developed well-nourished alert cooperative    who appearsr stated age in no acute distress.  HEENT: normocephalic atraumatic , Eyes: PERRL EOM's full, conjunctiva clear, Nares: paten,t no deformity discharge or tenderness., Ears: no deformity EAC's clear TMs with normal landmarks. Mouth:masked  NECK: supple without masses, thyromegaly or bruits. CHEST/PULM:  Clear to auscultation and percussion breath sounds equal no wheeze , rales or rhonchi. No chest wall deformities or tenderness. Breast: normal by inspection . No dimpling, discharge, masses, tenderness or discharge . CV: PMI is nondisplaced, S1 S2 no gallops, murmurs, rubs. Peripheral pulses are full without delay.No JVD .  ABDOMEN: Bowel sounds normal nontender  No guard or rebound, no hepato splenomegal no CVA tenderness.  No hernia. Extremtities:  No clubbing cyanosis or edema, no acute joint swelling or redness no focal atrophy NEURO:  Oriented x3, cranial nerves 3-12 appear to be intact, no obvious focal weakness,gait within normal limits no abnormal reflexes or asymmetrical SKIN: No acute rashes normal  turgor, color, no bruising or petechiae.  [place] mostly quiescent.  Masked PSYCH: Oriented, good eye contact, no obvious depression anxiety, cognition and judgment appear normal. LN: no cervical axillary inguinal adenopathy  Lab Results  Component Value Date   WBC 4.5 07/22/2018   HGB 13.2 07/22/2018   HCT 39.8 07/22/2018   PLT 201.0 07/22/2018   GLUCOSE 88 07/22/2018   CHOL 147 07/22/2018   TRIG 46.0 07/22/2018   HDL 56.60 07/22/2018   LDLCALC 81 07/22/2018   ALT 25 07/22/2018   AST 16 07/22/2018   NA 136 07/22/2018   K  4.3 07/22/2018   CL 101 07/22/2018   CREATININE 0.72 07/22/2018   BUN 12 07/22/2018   CO2 29 07/22/2018   TSH 1.11 07/22/2018   HGBA1C 5.2 07/19/2015    BP Readings from Last 3 Encounters:  10/04/20 116/70  10/03/19 116/68  01/24/19 114/72    Lab plan  reviewed with patient   Hearing Screening             Right ear:   Pass Pass Pass Pass Pass    Left ear:   Pass Pass Pass Pass Pass      ASSESSMENT AND PLAN:  Discussed the following assessment and plan:    ICD-10-CM   1. Visit for preventive health examination  Z00.00 CBC with Differential/Platelet    BASIC METABOLIC PANEL WITH GFR    Lipid panel    Hepatic function panel    TSH    Hemoglobin A1c    Hepatitis C antibody    RPR    HIV Antibody (routine testing w rflx)    HIV Antibody (routine testing w rflx)    RPR    Hepatitis C antibody    Hemoglobin A1c    TSH    Hepatic function panel    Lipid panel    BASIC METABOLIC PANEL WITH GFR    CBC with Differential/Platelet  2. Medication management  Z79.899 CBC with Differential/Platelet    BASIC METABOLIC PANEL WITH GFR    Lipid panel    Hepatic function panel    TSH    Hemoglobin A1c    Hepatitis C antibody    RPR    HIV Antibody (routine testing w rflx)    HIV Antibody (routine testing w rflx)    RPR    Hepatitis C antibody    Hemoglobin A1c    TSH    Hepatic function panel      Lipid panel    BASIC METABOLIC PANEL WITH GFR    CBC with Differential/Platelet  3. Attention deficit disorder, unspecified hyperactivity presence  F98.8 CBC with Differential/Platelet    BASIC METABOLIC PANEL WITH GFR    Lipid panel    Hepatic function panel    TSH    Hemoglobin A1c    Hepatitis C antibody    RPR    HIV Antibody (routine testing w rflx)    HIV Antibody (routine testing w rflx)    RPR    Hepatitis C antibody    Hemoglobin A1c    TSH    Hepatic function panel    Lipid panel    BASIC METABOLIC PANEL WITH GFR    CBC with Differential/Platelet  4. Oral contraceptive pill surveillance  Z30.41 CBC with Differential/Platelet    BASIC METABOLIC PANEL WITH GFR    Lipid panel    Hepatic function panel    TSH    Hemoglobin A1c    Hepatitis C antibody    RPR    HIV Antibody (routine testing w rflx)    HIV Antibody (routine testing w rflx)    RPR    Hepatitis C antibody    Hemoglobin A1c    TSH    Hepatic function panel    Lipid panel    BASIC METABOLIC PANEL WITH GFR    CBC with Differential/Platelet  5. Acne vulgaris  L70.0 CBC with Differential/Platelet    BASIC METABOLIC PANEL WITH GFR    Lipid panel    Hepatic function panel    TSH    Hemoglobin A1c  Hepatitis C antibody    RPR    HIV Antibody (routine testing w rflx)    HIV Antibody (routine testing w rflx)    RPR    Hepatitis C antibody    Hemoglobin A1c    TSH    Hepatic function panel    Lipid panel    BASIC METABOLIC PANEL WITH GFR    CBC with Differential/Platelet  6. Routine screening for STI (sexually transmitted infection)  Z11.3 CBC with Differential/Platelet    BASIC METABOLIC PANEL WITH GFR    Lipid panel    Hepatic function panel    TSH    Hemoglobin A1c    Hepatitis C antibody    RPR    HIV Antibody (routine testing w rflx)    HIV Antibody (routine testing w rflx)    RPR    Hepatitis C antibody    Hemoglobin A1c    TSH    Hepatic function panel    Lipid panel     BASIC METABOLIC PANEL WITH GFR    CBC with Differential/Platelet  Has some difficulty perhaps with hearing related to mask hearing screen done today. Continue same medication plan return for GYN Pap smear at next med check. Let us know in interim if needed. We will let her know when labs results available. No change in medication at this time. Return for 4 months for pap smear and med check  .  Patient Care Team: [place] Landgrebe, Neta Mends, MD as PCP - General (Internal Medicine) Patient Instructions  Lab today   Plan rov in about 4 months for med check and pap smear .   No change in meds at this time.    Health Maintenance, Female Adopting a healthy lifestyle and getting preventive care are important in promoting health and wellness. Ask your health care provider about:  The right schedule for you to have regular tests and exams.  Things you can do on your own to prevent diseases and keep yourself healthy. What should I know about diet, weight, and exercise? Eat a healthy diet   Eat a diet that includes plenty of vegetables, fruits, low-fat dairy products, and lean protein.  Do not eat a lot of foods that are high in solid fats, added sugars, or sodium. Maintain a healthy weight Body mass index (BMI) is used to identify weight problems. It estimates body fat based on height and weight. Your health care provider can help determine your BMI and help you achieve or maintain a healthy weight. Get regular exercise Get regular exercise. This is one of the most important things you can do for your health. Most adults should:  Exercise for at least 150 minutes each week. The exercise should increase your heart rate and make you sweat (moderate-intensity exercise).  Do strengthening exercises at least twice a week. This is in addition to the moderate-intensity exercise.  Spend less time sitting. Even light physical activity can be beneficial. Watch cholesterol and blood lipids Have your blood  tested for lipids and cholesterol at 32 years of age, then have this test every 5 years. Have your cholesterol levels checked more often if:  Your lipid or cholesterol levels are high.  You are older than 32 years of age.  You are at high risk for heart disease. What should I know about cancer screening? Depending on your health history and family history, you may need to have cancer screening at various ages. This may include screening for:  Breast cancer.  Cervical cancer.  Colorectal cancer.  Skin cancer.  Lung cancer. What should I know about heart disease, diabetes, and high blood pressure? Blood pressure and heart disease  High blood pressure causes heart disease and increases the risk of stroke. This is more likely to develop in people who have high blood pressure readings, are of African descent, or are overweight.  Have your blood pressure checked: ? Every 3-5 years if you are 25-72 years of age. ? Every year if you are 82 years old or older. Diabetes Have regular diabetes screenings. This checks your fasting blood sugar level. Have the screening done:  Once every three years after age 70 if you are at a normal weight and have a low risk for diabetes.  More often and at a younger age if you are overweight or have a high risk for diabetes. What should I know about preventing infection? Hepatitis B If you have a higher risk for hepatitis B, you should be screened for this virus. Talk with your health care provider to find out if you are at risk for hepatitis B infection. Hepatitis C Testing is recommended for:  Everyone born from [birth year] through 1965.  Anyone with known risk factors for hepatitis C. Sexually transmitted infections (STIs)  Get screened for STIs, including gonorrhea and chlamydia, if: ? You are sexually active and are younger than 32 years of age. ? You are older than 32 years of age and your health care provider tells you that you are at risk for  this type of infection. ? Your sexual activity has changed since you were last screened, and you are at increased risk for chlamydia or gonorrhea. Ask your health care provider if you are at risk.  Ask your health care provider about whether you are at high risk for HIV. Your health care provider may recommend a prescription medicine to help prevent HIV infection. If you choose to take medicine to prevent HIV, you should first get tested for HIV. You should then be tested every 3 months for as long as you are taking the medicine. Pregnancy  If you are about to stop having your period (premenopausal) and you may become pregnant, seek counseling before you get pregnant.  Take 400 to 800 micrograms (mcg) of folic acid every day if you become pregnant.  Ask for birth control (contraception) if you want to prevent pregnancy. Osteoporosis and menopause Osteoporosis is a disease in which the bones lose minerals and strength with aging. This can result in bone fractures. If you are 32 years old or older, or if you are at risk for osteoporosis and fractures, ask your health care provider if you should:  Be screened for bone loss.  Take a calcium or vitamin D supplement to lower your risk of fractures.  Be given hormone replacement therapy (HRT) to treat symptoms of menopause. Follow these instructions at home: Lifestyle  Do not use any products that contain nicotine or tobacco, such as cigarettes, e-cigarettes, and chewing tobacco. If you need help quitting, ask your health care provider.  Do not use street drugs.  Do not share needles.  Ask your health care provider for help if you need support or information about quitting drugs. Alcohol use  Do not drink alcohol if: ? Your health care provider tells you not to drink. ? You are pregnant, may be pregnant, or are planning to become pregnant.  If you drink alcohol: ? Limit how much you use to 0-1 drink a day. ?  Limit intake if you are  breastfeeding.  Be aware of how much alcohol is in your drink. In the [place] one drink equals one 12 oz bottle of beer (355 mL), one 5 oz glass of wine (148 mL), or one 1 oz glass of hard liquor (44 mL). General instructions  Schedule regular health, dental, and eye exams.  Stay current with your vaccines.  Tell your health care provider if: ? You often feel depressed. ? You have ever been abused or do not feel safe at home. Summary  Adopting a healthy lifestyle and getting preventive care are important in promoting health and wellness.  Follow your health care provider's instructions about healthy diet, exercising, and getting tested or screened for diseases.  Follow your health care provider's instructions on monitoring your cholesterol and blood pressure. This information is not intended to replace advice given to you by your health care provider. Make sure you discuss any questions you have with your health care provider. Document Revised: 11/09/2018 Document Reviewed: 11/09/2018 Elsevier Patient Education  2020 [employer].  [place] K. Justn Quale M.D.

## 2020-10-04 ENCOUNTER — Encounter: Payer: Self-pay | Admitting: Internal Medicine

## 2020-10-04 ENCOUNTER — Ambulatory Visit (INDEPENDENT_AMBULATORY_CARE_PROVIDER_SITE_OTHER): Payer: PRIVATE HEALTH INSURANCE | Admitting: Internal Medicine

## 2020-10-04 ENCOUNTER — Other Ambulatory Visit: Payer: Self-pay

## 2020-10-04 VITALS — BP 116/70 | HR 96 | Temp 98.3°F | Ht 65.0 in | Wt 159.8 lb

## 2020-10-04 DIAGNOSIS — F988 Other specified behavioral and emotional disorders with onset usually occurring in childhood and adolescence: Secondary | ICD-10-CM

## 2020-10-04 DIAGNOSIS — L700 Acne vulgaris: Secondary | ICD-10-CM | POA: Diagnosis not present

## 2020-10-04 DIAGNOSIS — Z01419 Encounter for gynecological examination (general) (routine) without abnormal findings: Secondary | ICD-10-CM

## 2020-10-04 DIAGNOSIS — Z0000 Encounter for general adult medical examination without abnormal findings: Secondary | ICD-10-CM | POA: Diagnosis not present

## 2020-10-04 DIAGNOSIS — Z79899 Other long term (current) drug therapy: Secondary | ICD-10-CM

## 2020-10-04 DIAGNOSIS — Z113 Encounter for screening for infections with a predominantly sexual mode of transmission: Secondary | ICD-10-CM | POA: Diagnosis not present

## 2020-10-04 DIAGNOSIS — Z3041 Encounter for surveillance of contraceptive pills: Secondary | ICD-10-CM | POA: Diagnosis not present

## 2020-10-04 NOTE — Patient Instructions (Addendum)
Lab today   Plan rov in about 4 months for med check and pap smear .   No change in meds at this time.    Health Maintenance, Female Adopting a healthy lifestyle and getting preventive care are important in promoting health and wellness. Ask your health care provider about:  The right schedule for you to have regular tests and exams.  Things you can do on your own to prevent diseases and keep yourself healthy. What should I know about diet, weight, and exercise? Eat a healthy diet   Eat a diet that includes plenty of vegetables, fruits, low-fat dairy products, and lean protein.  Do not eat a lot of foods that are high in solid fats, added sugars, or sodium. Maintain a healthy weight Body mass index (BMI) is used to identify weight problems. It estimates body fat based on height and weight. Your health care provider can help determine your BMI and help you achieve or maintain a healthy weight. Get regular exercise Get regular exercise. This is one of the most important things you can do for your health. Most adults should:  Exercise for at least 150 minutes each week. The exercise should increase your heart rate and make you sweat (moderate-intensity exercise).  Do strengthening exercises at least twice a week. This is in addition to the moderate-intensity exercise.  Spend less time sitting. Even light physical activity can be beneficial. Watch cholesterol and blood lipids Have your blood tested for lipids and cholesterol at 32 years of age, then have this test every 5 years. Have your cholesterol levels checked more often if:  Your lipid or cholesterol levels are high.  You are older than 32 years of age.  You are at high risk for heart disease. What should I know about cancer screening? Depending on your health history and family history, you may need to have cancer screening at various ages. This may include screening for:  Breast cancer.  Cervical cancer.  Colorectal  cancer.  Skin cancer.  Lung cancer. What should I know about heart disease, diabetes, and high blood pressure? Blood pressure and heart disease  High blood pressure causes heart disease and increases the risk of stroke. This is more likely to develop in people who have high blood pressure readings, are of African descent, or are overweight.  Have your blood pressure checked: ? Every 3-5 years if you are 57-20 years of age. ? Every year if you are 45 years old or older. Diabetes Have regular diabetes screenings. This checks your fasting blood sugar level. Have the screening done:  Once every three years after age 52 if you are at a normal weight and have a low risk for diabetes.  More often and at a younger age if you are overweight or have a high risk for diabetes. What should I know about preventing infection? Hepatitis B If you have a higher risk for hepatitis B, you should be screened for this virus. Talk with your health care provider to find out if you are at risk for hepatitis B infection. Hepatitis C Testing is recommended for:  Everyone born from [birth year] through 1963.  Anyone with known risk factors for hepatitis C. Sexually transmitted infections (STIs)  Get screened for STIs, including gonorrhea and chlamydia, if: ? You are sexually active and are younger than 32 years of age. ? You are older than 32 years of age and your health care provider tells you that you are at risk for this type  of infection. ? Your sexual activity has changed since you were last screened, and you are at increased risk for chlamydia or gonorrhea. Ask your health care provider if you are at risk.  Ask your health care provider about whether you are at high risk for HIV. Your health care provider may recommend a prescription medicine to help prevent HIV infection. If you choose to take medicine to prevent HIV, you should first get tested for HIV. You should then be tested every 3 months for as long as  you are taking the medicine. Pregnancy  If you are about to stop having your period (premenopausal) and you may become pregnant, seek counseling before you get pregnant.  Take 400 to 800 micrograms (mcg) of folic acid every day if you become pregnant.  Ask for birth control (contraception) if you want to prevent pregnancy. Osteoporosis and menopause Osteoporosis is a disease in which the bones lose minerals and strength with aging. This can result in bone fractures. If you are 47 years old or older, or if you are at risk for osteoporosis and fractures, ask your health care provider if you should:  Be screened for bone loss.  Take a calcium or vitamin D supplement to lower your risk of fractures.  Be given hormone replacement therapy (HRT) to treat symptoms of menopause. Follow these instructions at home: Lifestyle  Do not use any products that contain nicotine or tobacco, such as cigarettes, e-cigarettes, and chewing tobacco. If you need help quitting, ask your health care provider.  Do not use street drugs.  Do not share needles.  Ask your health care provider for help if you need support or information about quitting drugs. Alcohol use  Do not drink alcohol if: ? Your health care provider tells you not to drink. ? You are pregnant, may be pregnant, or are planning to become pregnant.  If you drink alcohol: ? Limit how much you use to 0-1 drink a day. ? Limit intake if you are breastfeeding.  Be aware of how much alcohol is in your drink. In the [place] one drink equals one 12 oz bottle of beer (355 mL), one 5 oz glass of wine (148 mL), or one 1 oz glass of hard liquor (44 mL). General instructions  Schedule regular health, dental, and eye exams.  Stay current with your vaccines.  Tell your health care provider if: ? You often feel depressed. ? You have ever been abused or do not feel safe at home. Summary  Adopting a healthy lifestyle and getting preventive care are  important in promoting health and wellness.  Follow your health care provider's instructions about healthy diet, exercising, and getting tested or screened for diseases.  Follow your health care provider's instructions on monitoring your cholesterol and blood pressure. This information is not intended to replace advice given to you by your health care provider. Make sure you discuss any questions you have with your health care provider. Document Revised: 11/09/2018 Document Reviewed: 11/09/2018 Elsevier Patient Education  2020 [employer].

## 2020-10-07 LAB — HEPATIC FUNCTION PANEL
AG Ratio: 1.6 (calc) (ref 1.0–2.5)
ALT: 12 U/L (ref 6–29)
AST: 12 U/L (ref 10–30)
Albumin: 4.3 g/dL (ref 3.6–5.1)
Alkaline phosphatase (APISO): 54 U/L (ref 31–125)
Bilirubin, Direct: 0.1 mg/dL (ref 0.0–0.2)
Globulin: 2.7 g/dL (calc) (ref 1.9–3.7)
Indirect Bilirubin: 0.6 mg/dL (calc) (ref 0.2–1.2)
Total Bilirubin: 0.7 mg/dL (ref 0.2–1.2)
Total Protein: 7 g/dL (ref 6.1–8.1)

## 2020-10-07 LAB — TSH: TSH: 1.28 mIU/L

## 2020-10-07 LAB — HEPATITIS C ANTIBODY
Hepatitis C Ab: NONREACTIVE
SIGNAL TO CUT-OFF: 0.01 (ref <1.00)

## 2020-10-07 LAB — BASIC METABOLIC PANEL WITH GFR
BUN: 15 mg/dL (ref 7–25)
CO2: 22 mmol/L (ref 20–32)
Calcium: 9.5 mg/dL (ref 8.6–10.2)
Chloride: 103 mmol/L (ref 98–110)
Creat: 0.72 mg/dL (ref 0.50–1.10)
GFR, Est African American: 128 mL/min/{1.73_m2} (ref >=60)
GFR, Est Non African American: 111 mL/min/{1.73_m2} (ref >=60)
Glucose, Bld: 98 mg/dL (ref 65–99)
Potassium: 4.3 mmol/L (ref 3.5–5.3)
Sodium: 136 mmol/L (ref 135–146)

## 2020-10-07 LAB — CBC WITH DIFFERENTIAL/PLATELET
Absolute Monocytes: 218 cells/uL (ref 200–950)
Basophils Absolute: 31 cells/uL (ref 0–200)
Basophils Relative: 0.6 %
Eosinophils Absolute: 52 cells/uL (ref 15–500)
Eosinophils Relative: 1 %
HCT: 39.4 % (ref 35.0–45.0)
Hemoglobin: 13.3 g/dL (ref 11.7–15.5)
Lymphs Abs: 1565 cells/uL (ref 850–3900)
MCH: 31.1 pg (ref 27.0–33.0)
MCHC: 33.8 g/dL (ref 32.0–36.0)
MCV: 92.3 fL (ref 80.0–100.0)
MPV: 10.8 fL (ref 7.5–12.5)
Monocytes Relative: 4.2 %
Neutro Abs: 3333 cells/uL (ref 1500–7800)
Neutrophils Relative %: 64.1 %
Platelets: 240 10*3/uL (ref 140–400)
RBC: 4.27 10*6/uL (ref 3.80–5.10)
RDW: 11.7 % (ref 11.0–15.0)
Total Lymphocyte: 30.1 %
WBC: 5.2 10*3/uL (ref 3.8–10.8)

## 2020-10-07 LAB — LIPID PANEL
Cholesterol: 181 mg/dL (ref <200)
HDL: 81 mg/dL (ref >=50)
LDL Cholesterol (Calc): 83 mg/dL (calc)
Non-HDL Cholesterol (Calc): 100 mg/dL (calc) (ref <130)
Total CHOL/HDL Ratio: 2.2 (calc) (ref <5.0)
Triglycerides: 84 mg/dL (ref <150)

## 2020-10-07 LAB — HEMOGLOBIN A1C
Hgb A1c MFr Bld: 5.2 % of total Hgb (ref <5.7)
Mean Plasma Glucose: 103 (calc)
eAG (mmol/L): 5.7 (calc)

## 2020-10-07 LAB — HIV ANTIBODY (ROUTINE TESTING W REFLEX): HIV 1&2 Ab, 4th Generation: NONREACTIVE

## 2020-10-07 LAB — RPR: RPR Ser Ql: NONREACTIVE

## 2020-10-08 NOTE — Progress Notes (Signed)
Lab results are normal.

## 2020-12-02 ENCOUNTER — Other Ambulatory Visit: Payer: Self-pay | Admitting: Internal Medicine

## 2021-01-05 ENCOUNTER — Other Ambulatory Visit: Payer: Self-pay

## 2021-01-07 MED ORDER — AMPHETAMINE-DEXTROAMPHET ER 20 MG PO CP24
20.0000 mg | ORAL_CAPSULE | ORAL | 0 refills | Status: DC
Start: 2021-01-07 — End: 2021-03-05

## 2021-01-07 MED ORDER — AMPHETAMINE-DEXTROAMPHET ER 20 MG PO CP24: 20.0000 mg | ORAL_CAPSULE | ORAL | 0 refills | Status: DC

## 2021-01-07 MED ORDER — AMPHETAMINE-DEXTROAMPHET ER 20 MG PO CP24: 20.0000 mg | ORAL_CAPSULE | Freq: Every day | ORAL | 0 refills | Status: DC

## 2021-01-07 NOTE — Telephone Encounter (Signed)
3 scrips   ( 90 days sent in) Sent in electronically .

## 2021-02-03 ENCOUNTER — Encounter: Payer: PRIVATE HEALTH INSURANCE | Admitting: Internal Medicine

## 2021-03-04 NOTE — Progress Notes (Signed)
Chief Complaint  Patient presents with  . Annual Exam    HPI: Barbara Cuevas 35 y.o. come in for pap and med check   She she has been on "Adderall for quite a while not sure is working as well usually wears off in the afternoon when things get more difficult at work no significant side effects.  Discussed other options wonders if she should stay on an stimulant medicine.  Sleep is good exercise nutrition etc. no other main change in health.  No change in vision or hearing.  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  Busy and stressful.  Periods okay light  Due for Pap smear no GYN symptoms 1 partner    ROS: See pertinent positives and negatives per HPI.  No obvious cardiovascular or pulmonary symptoms.  Past Medical History:  Diagnosis Date  . Hx of varicella     Family History  Problem Relation Age of Onset  . Heart disease Father 17       Open-heart surgery  . Diabetes type II Other        Parent,Grandparent  . Arthritis Other        Parent  . ADD / ADHD Brother     Social History   Socioeconomic History  . Marital status: Single    Spouse name: Not on file  . Number of children: Not on file  . Years of education: Not on file  . Highest education level: Not on file  Occupational History  . Not on file  Tobacco Use  . Smoking status: Never Smoker  . Smokeless tobacco: Never Used  Vaping Use  . Vaping Use: Never used  Substance and Sexual Activity  . Alcohol use: Yes    Comment: socially  . Drug use: No  . Sexual activity: Not on file  Other Topics Concern  . 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   Social Determinants of Health   Financial Resource Strain: Not on file   Food Insecurity: Not on file  Transportation Needs: Not on file  Physical Activity: Not on file  Stress: Not on file  Social Connections: Not on file    Outpatient Medications Prior to Visit  Medication Sig Dispense Refill  . drospirenone-ethinyl estradiol (YASMIN) 3-0.03 MG tablet TAKE 1 TABLET BY MOUTH DAILY. 84 tablet 1  . tretinoin (RETIN-A) 0.1 % cream Apply topically at bedtime. For acne 45 g 2  . amphetamine-dextroamphetamine (ADDERALL XR) 20 MG 24 hr capsule Take 1 capsule (20 mg total) by mouth every morning. 30 capsule 0  . amphetamine-dextroamphetamine (ADDERALL XR) 20 MG 24 hr capsule Take 1 capsule (20 mg total) by mouth daily. 30 capsule 0  . amphetamine-dextroamphetamine (ADDERALL XR) 20 MG 24 hr capsule Take 1 capsule (20 mg total) by mouth every morning. Fill second 30 capsule 0   No facility-administered medications prior to visit.     EXAM:  BP 110/80 (BP Location: Left Arm, Patient Position: Sitting, Cuff Size: Normal)   Pulse 98   Temp 98.3 F (36.8 C) (Oral)   Ht 5\' 6"  (1.676 m)   Wt  163 lb 9.6 oz (74.2 kg)   LMP 02/02/2021 (Approximate)   SpO2 99%   BMI 26.41 kg/m   Body mass index is 26.41 kg/m.  GENERAL: vitals reviewed and listed above, alert, oriented, appears well hydrated and in no acute distress HEENT: atraumatic, conjunctiva  clear, no obvious abnormalities on inspection of external nose and ears OP masked NECK: no obvious masses on inspection palpation  MS: moves all extremities without noticeable focal  Abnormality Pelvic: NL ext GU, labia clear without lesions or rash . Vagina no lesions .Cervix: clear except for some brown blood ectopy flat 1+ UTERUS: Neg CMT Adnexa:  clear no masses . PAP done with HPV and GC chlamydia screen. PSYCH: pleasant and cooperative, no obvious depression or anxiety Lab Results  Component Value Date   WBC 5.2 10/04/2020   HGB 13.3 10/04/2020   HCT 39.4 10/04/2020   PLT 240 10/04/2020   GLUCOSE 98 10/04/2020    CHOL 181 10/04/2020   TRIG 84 10/04/2020   HDL 81 10/04/2020   LDLCALC 83 10/04/2020   ALT 12 10/04/2020   AST 12 10/04/2020   NA 136 10/04/2020   K 4.3 10/04/2020   CL 103 10/04/2020   CREATININE 0.72 10/04/2020   BUN 15 10/04/2020   CO2 22 10/04/2020   TSH 1.28 10/04/2020   HGBA1C 5.2 10/04/2020   BP Readings from Last 3 Encounters:  03/05/21 110/80  10/04/20 116/70  10/03/19 116/68    ASSESSMENT AND PLAN:  Discussed the following assessment and plan:  Attention deficit disorder, unspecified hyperactivity presence  Medication management  Encounter for gynecological examination without abnormal finding - Plan: PAP [[place]], Cervicovaginal ancillary only  Oral contraceptive pill surveillance Discussed options with medications,  would increase dose at this time probably work the best if she is attentive to environmental arrangements and organization of her job and that is not enough.  Help. As possible is wearing off can try going up to 25 mg Adderall with option to go to 30 mg Otherwise consideration of long-acting such as Vyvanse and or change to Concerta type medicines.  Patient is aware We will keep [place] updated in 3 to 4 weeks or as needed  -Patient advised to return or notify health care team  if  new concerns arise.  Patient Instructions  Increase dose adderall for now  25 mg   . Could increase to 30 mg .  consider change to longer acting medication  . Continue other strategies.   Let me know in 3-4 weeks how new dose is working   consider increase dose or other at that time Plan  ROV in 4-6 months  Or as indicated   Burna Mortimer K. Derreon Consalvo M.D.

## 2021-03-05 ENCOUNTER — Encounter: Payer: Self-pay | Admitting: Internal Medicine

## 2021-03-05 ENCOUNTER — Other Ambulatory Visit (HOSPITAL_COMMUNITY)
Admission: RE | Admit: 2021-03-05 | Discharge: 2021-03-05 | Disposition: A | Discharge status: Home / Self Care | Payer: PRIVATE HEALTH INSURANCE | Source: Ambulatory Visit | Attending: Internal Medicine | Admitting: Internal Medicine

## 2021-03-05 ENCOUNTER — Other Ambulatory Visit: Payer: Self-pay

## 2021-03-05 ENCOUNTER — Ambulatory Visit (INDEPENDENT_AMBULATORY_CARE_PROVIDER_SITE_OTHER): Payer: PRIVATE HEALTH INSURANCE | Admitting: Internal Medicine

## 2021-03-05 VITALS — BP 110/80 | HR 98 | Temp 98.3°F | Ht 66.0 in | Wt 163.6 lb

## 2021-03-05 DIAGNOSIS — Z79899 Other long term (current) drug therapy: Secondary | ICD-10-CM | POA: Diagnosis not present

## 2021-03-05 DIAGNOSIS — L700 Acne vulgaris: Secondary | ICD-10-CM

## 2021-03-05 DIAGNOSIS — Z01419 Encounter for gynecological examination (general) (routine) without abnormal findings: Secondary | ICD-10-CM | POA: Insufficient documentation

## 2021-03-05 DIAGNOSIS — F988 Other specified behavioral and emotional disorders with onset usually occurring in childhood and adolescence: Secondary | ICD-10-CM

## 2021-03-05 DIAGNOSIS — Z3041 Encounter for surveillance of contraceptive pills: Secondary | ICD-10-CM | POA: Diagnosis not present

## 2021-03-05 MED ORDER — AMPHETAMINE-DEXTROAMPHET ER 25 MG PO CP24: 25.0000 mg | ORAL_CAPSULE | ORAL | 0 refills | Status: DC

## 2021-03-05 NOTE — Patient Instructions (Signed)
Increase dose adderall for now  25 mg   . Could increase to 30 mg .  consider change to longer acting medication  . Continue other strategies.   Let me know in 3-4 weeks how new dose is working   consider increase dose or other at that time Plan  ROV in 4-6 months  Or as indicated

## 2021-03-06 LAB — CERVICOVAGINAL ANCILLARY ONLY
Chlamydia: NEGATIVE
Comment: NEGATIVE
Comment: NORMAL
Neisseria Gonorrhea: NEGATIVE

## 2021-03-07 LAB — CYTOLOGY - PAP
Adequacy: ABSENT
Comment: NEGATIVE
Diagnosis: NEGATIVE
High risk HPV: NEGATIVE

## 2021-03-07 NOTE — Progress Notes (Signed)
Tell patient PAP is normal. HPV high  risk  and chlamydia screens are negative

## 2021-04-09 ENCOUNTER — Other Ambulatory Visit: Payer: Self-pay

## 2021-04-09 MED ORDER — AMPHETAMINE-DEXTROAMPHET ER 25 MG PO CP24: 25.0000 mg | ORAL_CAPSULE | ORAL | 0 refills | Status: DC

## 2021-04-09 NOTE — Telephone Encounter (Signed)
Glad doing better  Sent in rx

## 2021-04-09 NOTE — Telephone Encounter (Signed)
Glad increased dose has been helpful refilled medicine at 25 mg XR Adderall

## 2021-05-08 ENCOUNTER — Other Ambulatory Visit: Payer: Self-pay

## 2021-05-09 MED ORDER — AMPHETAMINE-DEXTROAMPHET ER 25 MG PO CP24: 25.0000 mg | ORAL_CAPSULE | ORAL | 0 refills | Status: DC

## 2021-05-09 NOTE — Telephone Encounter (Signed)
Last OV: 0406/2022 Last refill: 04/09/2021  Disp: 30  R: 0  Future OV: 07/07/2021

## 2021-06-04 ENCOUNTER — Encounter: Payer: Self-pay | Admitting: Internal Medicine

## 2021-07-07 ENCOUNTER — Ambulatory Visit: Payer: PRIVATE HEALTH INSURANCE | Admitting: Internal Medicine

## 2021-07-13 NOTE — Progress Notes (Signed)
Chief Complaint  Patient presents with   Follow-up     HPI: EDIT RICCIARDELLI 35 y.o. come in for medication evaluation increase from Adderall 25 XR from 20 mg however on the last prescription filled in July she was given the 20 mg XR and is noticed a big difference.  On more forgetful more of a brain fog.  Did better on the 25 No significant side effects initially had some racing heart but it was gone. Some of her symptoms might be left over from COVID infection.  She also stopped her birth control pills because she did not think it was helping her acne is much as possible and was concerned about side effect risk profile after COVID. Uses condoms 1 partner.  ROS: See pertinent positives and negatives per HPI.  Past Medical History:  Diagnosis Date   Hx of varicella     Family History  Problem Relation Age of Onset   Heart disease Father 30       Open-heart surgery   Diabetes type II Other        Parent,Grandparent   Arthritis Other        Parent   ADD / ADHD Brother     Social History   Socioeconomic History   Marital status: Single    Spouse name: Not on file   Number of children: Not on file   Years of education: Not on file   Highest education level: Not on file  Occupational History   Not on file  Tobacco Use   Smoking status: Never   Smokeless tobacco: Never  Vaping Use   Vaping Use: Never used  Substance and Sexual Activity   Alcohol use: Yes    Comment: socially   Drug use: No   Sexual activity: Not on file  Other Topics Concern   Not on file  Social History Narrative   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   Social Determinants of Health   Financial Resource Strain: Not on file  Food Insecurity: Not on file   Transportation Needs: Not on file  Physical Activity: Not on file  Stress: Not on file  Social Connections: Not on file    Outpatient Medications Prior to Visit  Medication Sig Dispense Refill   tretinoin (RETIN-A) 0.1 % cream Apply topically at bedtime. For acne 45 g 2   amphetamine-dextroamphetamine (ADDERALL XR) 25 MG 24 hr capsule Take 1 capsule by mouth every morning. 30 capsule 0   drospirenone-ethinyl estradiol (YASMIN) 3-0.03 MG tablet TAKE 1 TABLET BY MOUTH DAILY. 84 tablet 1   No facility-administered medications prior to visit.     EXAM:  BP 106/70 (BP Location: Left Arm, Patient Position: Sitting, Cuff Size: Normal)   Pulse 84   Temp 98.6 F (37 C) (Oral)   Ht 5\' 6"  (1.676 m)   Wt 168 lb 6.4 oz (76.4 kg)   SpO2 99%   BMI 27.18 kg/m   Body mass index is 27.18 kg/m.  GENERAL: vitals reviewed and listed above, alert, oriented, appears well hydrated and in no acute distress HEENT: atraumatic, conjunctiva  clear, no obvious abnormalities on inspection of  external nose and ears OP : Masked no respiratory distress.  CV: HRRR, no clubbing cyanosis or  peripheral edema nl cap refill  MS: moves all extremities without noticeable focal  abnormality PSYCH: pleasant and cooperative, no obvious depression or anxiety Lab Results  Component Value Date   WBC 5.2 10/04/2020   HGB 13.3 10/04/2020   HCT 39.4 10/04/2020   PLT 240 10/04/2020   GLUCOSE 98 10/04/2020   CHOL 181 10/04/2020   TRIG 84 10/04/2020   HDL 81 10/04/2020   LDLCALC 83 10/04/2020   ALT 12 10/04/2020   AST 12 10/04/2020   NA 136 10/04/2020   K 4.3 10/04/2020   CL 103 10/04/2020   CREATININE 0.72 10/04/2020   BUN 15 10/04/2020   CO2 22 10/04/2020   TSH 1.28 10/04/2020   HGBA1C 5.2 10/04/2020   BP Readings from Last 3 Encounters:  07/14/21 106/70  03/05/21 110/80  10/04/20 116/70    ASSESSMENT AND PLAN:  Discussed the following assessment and plan:  Attention deficit disorder, unspecified  hyperactivity presence  Medication management  Oral contraceptive pill surveillance  Acne vulgaris Discussed risk benefits of medication It was unfortunate she was given the 20 mg XR uncertain how this happened except perhaps had previous prescription sent. Prescription sent for 25 mg XR she can keep [place] updated. If acne worsening consider going back on OCPs consideration of seeing dermatology for other assistance help with antibiotics etc.  Will let us know if we can help in this area. Otherwise ROV CPX med check 6 months. -Patient advised to return or notify health care team  if  new concerns arise.  Patient Instructions  Adderall 25 xr send in today  continue.   Let us know if  problems   otherwise  send in message for refills   Cpx  or visit in 6 months      Cailen Mihalik K. Benecio Kluger M.D.

## 2021-07-14 ENCOUNTER — Encounter: Payer: Self-pay | Admitting: Internal Medicine

## 2021-07-14 ENCOUNTER — Ambulatory Visit (INDEPENDENT_AMBULATORY_CARE_PROVIDER_SITE_OTHER): Payer: No Typology Code available for payment source | Admitting: Internal Medicine

## 2021-07-14 ENCOUNTER — Other Ambulatory Visit: Payer: Self-pay

## 2021-07-14 VITALS — BP 106/70 | HR 84 | Temp 98.6°F | Ht 66.0 in | Wt 168.4 lb

## 2021-07-14 DIAGNOSIS — Z79899 Other long term (current) drug therapy: Secondary | ICD-10-CM | POA: Diagnosis not present

## 2021-07-14 DIAGNOSIS — Z3041 Encounter for surveillance of contraceptive pills: Secondary | ICD-10-CM | POA: Diagnosis not present

## 2021-07-14 DIAGNOSIS — F988 Other specified behavioral and emotional disorders with onset usually occurring in childhood and adolescence: Secondary | ICD-10-CM | POA: Diagnosis not present

## 2021-07-14 DIAGNOSIS — L700 Acne vulgaris: Secondary | ICD-10-CM | POA: Diagnosis not present

## 2021-07-14 MED ORDER — AMPHETAMINE-DEXTROAMPHET ER 25 MG PO CP24: 25.0000 mg | ORAL_CAPSULE | ORAL | 0 refills | Status: DC

## 2021-07-14 NOTE — Patient Instructions (Addendum)
Adderall 25 xr send in today  continue.   Let us know if  problems   otherwise  send in message for refills   Cpx  or visit in 6 months

## 2021-08-15 ENCOUNTER — Other Ambulatory Visit: Payer: Self-pay

## 2021-08-15 MED ORDER — AMPHETAMINE-DEXTROAMPHET ER 25 MG PO CP24: 25.0000 mg | ORAL_CAPSULE | ORAL | 0 refills | Status: DC

## 2021-08-15 NOTE — Telephone Encounter (Signed)
Sent in electronically .  

## 2021-09-16 ENCOUNTER — Other Ambulatory Visit: Payer: Self-pay

## 2021-09-21 MED ORDER — AMPHETAMINE-DEXTROAMPHET ER 25 MG PO CP24: 25.0000 mg | ORAL_CAPSULE | ORAL | 0 refills | Status: DC

## 2021-09-21 NOTE — Telephone Encounter (Signed)
Sent in electronically .  

## 2021-10-27 ENCOUNTER — Other Ambulatory Visit: Payer: Self-pay | Admitting: Internal Medicine

## 2021-10-28 MED ORDER — AMPHETAMINE-DEXTROAMPHET ER 25 MG PO CP24: 25.0000 mg | ORAL_CAPSULE | ORAL | 0 refills | Status: DC

## 2021-10-28 NOTE — Telephone Encounter (Signed)
I sent in prescription for 30 capsules 3 prescriptions to cover 3 months. Hopefully that will work more smoothly.

## 2021-11-25 ENCOUNTER — Other Ambulatory Visit: Payer: Self-pay | Admitting: Internal Medicine

## 2021-11-26 MED ORDER — AMPHETAMINE-DEXTROAMPHET ER 25 MG PO CP24: 25.0000 mg | ORAL_CAPSULE | ORAL | 0 refills | Status: DC

## 2021-12-29 ENCOUNTER — Other Ambulatory Visit: Payer: Self-pay | Admitting: Internal Medicine

## 2021-12-31 MED ORDER — AMPHETAMINE-DEXTROAMPHET ER 25 MG PO CP24
25.0000 mg | ORAL_CAPSULE | ORAL | 0 refills | Status: DC
Start: 2021-12-31 — End: 2022-05-11

## 2022-01-14 ENCOUNTER — Ambulatory Visit (INDEPENDENT_AMBULATORY_CARE_PROVIDER_SITE_OTHER): Payer: PRIVATE HEALTH INSURANCE | Admitting: Internal Medicine

## 2022-01-14 ENCOUNTER — Encounter: Payer: Self-pay | Admitting: Internal Medicine

## 2022-01-14 VITALS — BP 112/80 | HR 91 | Temp 99.2°F | Ht 66.0 in | Wt 159.6 lb

## 2022-01-14 DIAGNOSIS — Z79899 Other long term (current) drug therapy: Secondary | ICD-10-CM | POA: Diagnosis not present

## 2022-01-14 DIAGNOSIS — Z6379 Other stressful life events affecting family and household: Secondary | ICD-10-CM | POA: Diagnosis not present

## 2022-01-14 DIAGNOSIS — F988 Other specified behavioral and emotional disorders with onset usually occurring in childhood and adolescence: Secondary | ICD-10-CM | POA: Diagnosis not present

## 2022-01-14 DIAGNOSIS — L259 Unspecified contact dermatitis, unspecified cause: Secondary | ICD-10-CM | POA: Diagnosis not present

## 2022-01-14 NOTE — Progress Notes (Signed)
Chief Complaint  Patient presents with   Medication Consultation    And rash on hand currently taking prednisone     HPI: Barbara Cuevas 36 y.o. come in for Chronic disease management   med check  Still taking Adderall Exar 25 dosing seems to be good takes every day denies any significant side effects Had to call several times last time for her refills. New problem with hand left upper extremity right neck rash that was very itchy [facility] placed on a 12-day prednisone taper is still on about 8 days into it and it is fading.  No known cause well otherwise.  Family illness stress plus or considering and looking at other jobs.  Father had V. tach had ICD implanted problem after a mitral valve replacement surgery but is doing better.  Is aware of stress.  Negative TAD of significance Sleep is fine off of prednisone. Regular exercise as possible Energy feels better off of OCPs. ROS: See pertinent positives and negatives per HPI.  Past Medical History:  Diagnosis Date   Hx of varicella     Family History  Problem Relation Age of Onset   Heart disease Father 98       Open-heart surgery   Diabetes type II Other        Parent,Grandparent   Arthritis Other        Parent   ADD / ADHD Brother     Social History   Socioeconomic History   Marital status: Single    Spouse name: Not on file   Number of children: Not on file   Years of education: Not on file   Highest education level: Not on file  Occupational History   Not on file  Tobacco Use   Smoking status: Never   Smokeless tobacco: Never  Vaping Use   Vaping Use: Never used  Substance and Sexual Activity   Alcohol use: Yes    Comment: socially   Drug use: No   Sexual activity: Not on file  Other Topics Concern   Not on file  Social History Narrative   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   Social Determinants of Health   Financial Resource Strain: Not on file  Food Insecurity: Not on file  Transportation Needs: Not on file  Physical Activity: Not on file  Stress: Not on file  Social Connections: Not on file    Outpatient Medications Prior to Visit  Medication Sig Dispense Refill   amphetamine-dextroamphetamine (ADDERALL XR) 25 MG 24 hr capsule Take 1 capsule by mouth every morning. Fill first 30 capsule 0   amphetamine-dextroamphetamine (ADDERALL XR) 25 MG 24 hr capsule Take 1 capsule by mouth every morning. Fill second 30 capsule 0   amphetamine-dextroamphetamine (ADDERALL XR) 25 MG 24 hr capsule Take 1 capsule by mouth every morning. Fill last 30 capsule 0   tretinoin (RETIN-A) 0.1 % cream Apply topically at bedtime. For acne 45 g 2   No facility-administered medications prior to visit.     EXAM:  BP 112/80 (BP Location: Left Arm, Patient Position: Sitting, Cuff Size: Normal)    Pulse 91  Temp 99.2 F (37.3 C) (Oral)    Ht 5\' 6"  (1.676 m)    Wt 159 lb 9.6 oz (72.4 kg)    LMP 01/12/2022    SpO2 99%    BMI 25.76 kg/m   Body mass index is 25.76 kg/m.  GENERAL: vitals reviewed and listed above, alert, oriented, appears well hydrated and in no acute distress HEENT: atraumatic, conjunctiva  clear, no obvious abnormalities on inspection of external nose and ears OP : masked  NECK: no obvious masses on inspection palpation  LUNGS: clear to auscultation bilaterally, no wheezes, rales or rhonchi, good air movement CV: HRRR, no clubbing cyanosis or  peripheral edema nl cap refill  MS: moves all extremities without noticeable focal  abnormality Skin faded erythema dorsum of right hand mid linear fading patches right neck left upper medial extremity.  No vesicles or swelling. PSYCH: pleasant and cooperative, no obvious depression or  anxiety Lab Results  Component Value Date   WBC 5.2 10/04/2020   HGB 13.3 10/04/2020   HCT 39.4 10/04/2020   PLT 240 10/04/2020   GLUCOSE 98 10/04/2020   CHOL 181 10/04/2020   TRIG 84 10/04/2020   HDL 81 10/04/2020   LDLCALC 83 10/04/2020   ALT 12 10/04/2020   AST 12 10/04/2020   NA 136 10/04/2020   K 4.3 10/04/2020   CL 103 10/04/2020   CREATININE 0.72 10/04/2020   BUN 15 10/04/2020   CO2 22 10/04/2020   TSH 1.28 10/04/2020   HGBA1C 5.2 10/04/2020   BP Readings from Last 3 Encounters:  01/14/22 112/80  07/14/21 106/70  03/05/21 110/80    ASSESSMENT AND PLAN:  Discussed the following assessment and plan:  Attention deficit disorder, unspecified hyperactivity presence  Medication management  Contact dermatitis, unspecified contact dermatitis type, unspecified trigger  Stress due to illness of family member - father with v tach after mvsurgery now has icddoing better  Benefit more than risk of medications  to continue. Approaraite  adaptation to stress reviewed options  continue  Finish prednisone.  6 mos cpx or med check   -Patient advised to return or notify health care team  if  new concerns arise.  Patient Instructions  Good to see yu today . Continue  adderall   check with pharmacy about  refills.   6 months cpx .or med check .   Standley Brooking. Allee Busk M.D.

## 2022-01-14 NOTE — Patient Instructions (Signed)
Good to see yu today . Continue  adderall   check with pharmacy about  refills.   6 months cpx .or med check .

## 2022-05-11 ENCOUNTER — Other Ambulatory Visit: Payer: Self-pay | Admitting: Internal Medicine

## 2022-05-13 MED ORDER — AMPHETAMINE-DEXTROAMPHET ER 25 MG PO CP24
25.0000 mg | ORAL_CAPSULE | ORAL | 0 refills | Status: DC
Start: 2022-05-13 — End: 2022-07-14

## 2022-05-13 MED ORDER — AMPHETAMINE-DEXTROAMPHET ER 25 MG PO CP24: 25.0000 mg | ORAL_CAPSULE | ORAL | 0 refills | Status: DC

## 2022-05-13 NOTE — Telephone Encounter (Signed)
Last Ov 01/14/22 Filled 12/31/21 Is it ok to refill?

## 2022-07-13 NOTE — Progress Notes (Unsigned)
No chief complaint on file.   HPI: Patient  Barbara Cuevas  36 y.o. comes in today for Preventive Health Care visit   Health Maintenance  Topic Date Due   TETANUS/TDAP  03/24/2021   INFLUENZA VACCINE  06/30/2022   COVID-19 Vaccine (3 - Moderna risk series) 07/14/2022 (Originally 03/27/2020)   PAP SMEAR-Modifier  03/05/2024   Hepatitis C Screening  Completed   HIV Screening  Completed   HPV VACCINES  Aged Out   Health Maintenance Review LIFESTYLE:  Exercise:   Tobacco/ETS: Alcohol:  Sugar beverages: Sleep: Drug use: no HH of  Work:    ROS:  GEN/ HEENT: No fever, significant weight changes sweats headaches vision problems hearing changes, CV/ PULM; No chest pain shortness of breath cough, syncope,edema  change in exercise tolerance. GI /GU: No adominal pain, vomiting, change in bowel habits. No blood in the stool. No significant GU symptoms. SKIN/HEME: ,no acute skin rashes suspicious lesions or bleeding. No lymphadenopathy, nodules, masses.  NEURO/ PSYCH:  No neurologic signs such as weakness numbness. No depression anxiety. IMM/ Allergy: No unusual infections.  Allergy .   REST of 12 system review negative except as per HPI   Past Medical History:  Diagnosis Date   Hx of varicella     No past surgical history on file.  Family History  Problem Relation Age of Onset   Heart disease Father 50       Open-heart surgery   Diabetes type II Other        Parent,Grandparent   Arthritis Other        Parent   ADD / ADHD Brother     Social History   Socioeconomic History   Marital status: Single    Spouse name: Not on file   Number of children: Not on file   Years of education: Not on file   Highest education level: Not on file  Occupational History   Not on file  Tobacco Use   Smoking status: Never   Smokeless tobacco: Never  Vaping Use   Vaping Use: Never used  Substance and Sexual Activity   Alcohol use: Yes    Comment: socially   Drug use: No    Sexual activity: Not on file  Other Topics Concern   Not on file  Social History Narrative   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   Social Determinants of Health   Financial Resource Strain: Not on file  Food Insecurity: Not on file  Transportation Needs: Not on file  Physical Activity: Not on file  Stress: Not on file  Social Connections: Not on file    Outpatient Medications Prior to Visit  Medication Sig Dispense Refill   amphetamine-dextroamphetamine (ADDERALL XR) 25 MG 24 hr capsule Take 1 capsule by mouth every morning. Fill first 30 capsule 0   amphetamine-dextroamphetamine (ADDERALL XR) 25 MG 24 hr capsule Take 1 capsule by mouth every morning. Fill second 30 capsule 0   amphetamine-dextroamphetamine (ADDERALL XR) 25 MG 24 hr capsule Take 1 capsule by mouth every morning. Fill last 30 capsule 0   tretinoin (RETIN-A) 0.1 % cream Apply topically at bedtime. For  acne 45 g 2   No facility-administered medications prior to visit.     EXAM:  There were no vitals taken for this visit.  There is no height or weight on file to calculate BMI. Wt Readings from Last 3 Encounters:  01/14/22 159 lb 9.6 oz (72.4 kg)  07/14/21 168 lb 6.4 oz (76.4 kg)  03/05/21 163 lb 9.6 oz (74.2 kg)    Physical Exam: Vital signs reviewed [name] is a well-developed well-nourished alert cooperative    who appearsr stated age in no acute distress.  HEENT: normocephalic atraumatic , Eyes: PERRL EOM's full, conjunctiva clear, Nares: paten,t no deformity discharge or tenderness., Ears: no deformity EAC's clear TMs with normal landmarks. Mouth: clear OP, no lesions, edema.  Moist mucous membranes. Dentition in adequate repair. NECK: supple without masses,  thyromegaly or bruits. CHEST/PULM:  Clear to auscultation and percussion breath sounds equal no wheeze , rales or rhonchi. No chest wall deformities or tenderness. Breast: normal by inspection . No dimpling, discharge, masses, tenderness or discharge . CV: PMI is nondisplaced, S1 S2 no gallops, murmurs, rubs. Peripheral pulses are full without delay.No JVD .  ABDOMEN: Bowel sounds normal nontender  No guard or rebound, no hepato splenomegal no CVA tenderness.  No hernia. Extremtities:  No clubbing cyanosis or edema, no acute joint swelling or redness no focal atrophy NEURO:  Oriented x3, cranial nerves 3-12 appear to be intact, no obvious focal weakness,gait within normal limits no abnormal reflexes or asymmetrical SKIN: No acute rashes normal turgor, color, no bruising or petechiae. PSYCH: Oriented, good eye contact, no obvious depression anxiety, cognition and judgment appear normal. LN: no cervical axillary inguinal adenopathy  Lab Results  Component Value Date   WBC 5.2 10/04/2020   HGB 13.3 10/04/2020   HCT 39.4 10/04/2020   PLT 240 10/04/2020   GLUCOSE 98 10/04/2020   CHOL 181 10/04/2020   TRIG 84 10/04/2020   HDL 81 10/04/2020   LDLCALC 83 10/04/2020   ALT 12 10/04/2020   AST 12 10/04/2020   NA 136 10/04/2020   K 4.3 10/04/2020   CL 103 10/04/2020   CREATININE 0.72 10/04/2020   BUN 15 10/04/2020   CO2 22 10/04/2020   TSH 1.28 10/04/2020   HGBA1C 5.2 10/04/2020    BP Readings from Last 3 Encounters:  01/14/22 112/80  07/14/21 106/70  03/05/21 110/80    Labplanreviewed with patient   ASSESSMENT AND PLAN:  Discussed the following assessment and plan:    ICD-10-CM   1. Visit for preventive health examination  Z00.00     2. Encounter for preventive health examination  Z00.00     3. Attention deficit disorder, unspecified hyperactivity presence  F98.8     4. Medication management  Z79.899     5. Oral contraceptive pill surveillance  Z30.41     6. Acne  vulgaris  L70.0      No follow-ups on file.  Patient Care Team: Janecia Palau, Neta Mends, MD as PCP - General (Internal Medicine) There are no Patient Instructions on file for this visit.  Neta Mends. Porschia Willbanks M.D.

## 2022-07-14 ENCOUNTER — Ambulatory Visit (INDEPENDENT_AMBULATORY_CARE_PROVIDER_SITE_OTHER): Payer: PRIVATE HEALTH INSURANCE | Admitting: Internal Medicine

## 2022-07-14 ENCOUNTER — Encounter: Payer: Self-pay | Admitting: Internal Medicine

## 2022-07-14 VITALS — BP 110/76 | HR 96 | Temp 97.7°F | Ht 65.75 in | Wt 148.0 lb

## 2022-07-14 DIAGNOSIS — F988 Other specified behavioral and emotional disorders with onset usually occurring in childhood and adolescence: Secondary | ICD-10-CM

## 2022-07-14 DIAGNOSIS — Z0000 Encounter for general adult medical examination without abnormal findings: Secondary | ICD-10-CM

## 2022-07-14 DIAGNOSIS — Z23 Encounter for immunization: Secondary | ICD-10-CM

## 2022-07-14 DIAGNOSIS — Z3041 Encounter for surveillance of contraceptive pills: Secondary | ICD-10-CM

## 2022-07-14 DIAGNOSIS — L700 Acne vulgaris: Secondary | ICD-10-CM

## 2022-07-14 DIAGNOSIS — Z79899 Other long term (current) drug therapy: Secondary | ICD-10-CM

## 2022-07-14 MED ORDER — AMPHETAMINE-DEXTROAMPHET ER 25 MG PO CP24: 25.0000 mg | ORAL_CAPSULE | ORAL | 0 refills | Status: DC

## 2022-07-14 MED ORDER — AMPHETAMINE-DEXTROAMPHET ER 25 MG PO CP24
25.0000 mg | ORAL_CAPSULE | ORAL | 0 refills | Status: DC
Start: 2022-07-14 — End: 2023-01-25

## 2022-07-14 NOTE — Patient Instructions (Addendum)
Good to see you today . Exam is normal  Last labs were  fine  can update next year unless  other reasons to check  Continue lifestyle intervention healthy eating and exercise .   Lab Results  Component Value Date   WBC 5.2 10/04/2020   HGB 13.3 10/04/2020   HCT 39.4 10/04/2020   PLT 240 10/04/2020   GLUCOSE 98 10/04/2020   CHOL 181 10/04/2020   TRIG 84 10/04/2020   HDL 81 10/04/2020   LDLCALC 83 10/04/2020   ALT 12 10/04/2020   AST 12 10/04/2020   NA 136 10/04/2020   K 4.3 10/04/2020   CL 103 10/04/2020   CREATININE 0.72 10/04/2020   BUN 15 10/04/2020   CO2 22 10/04/2020   TSH 1.28 10/04/2020   HGBA1C 5.2 10/04/2020

## 2022-12-09 ENCOUNTER — Other Ambulatory Visit: Payer: Self-pay | Admitting: Internal Medicine

## 2022-12-09 NOTE — Telephone Encounter (Signed)
Dr. Velora Mediate patient  Last OV/Last refill-07/14/22--30 tabs, 3 refills No future OV scheduled

## 2022-12-10 MED ORDER — AMPHETAMINE-DEXTROAMPHET ER 25 MG PO CP24: 25.0000 mg | ORAL_CAPSULE | ORAL | 0 refills | Status: DC

## 2023-01-25 ENCOUNTER — Other Ambulatory Visit: Payer: Self-pay | Admitting: Internal Medicine

## 2023-01-25 ENCOUNTER — Other Ambulatory Visit: Payer: Self-pay | Admitting: Adult Health

## 2023-01-25 MED ORDER — AMPHETAMINE-DEXTROAMPHET ER 25 MG PO CP24: 25.0000 mg | ORAL_CAPSULE | ORAL | 0 refills | Status: DC

## 2023-02-16 ENCOUNTER — Ambulatory Visit: Payer: PRIVATE HEALTH INSURANCE | Admitting: Internal Medicine

## 2023-02-22 NOTE — Progress Notes (Unsigned)
No chief complaint on file.   HPI: Barbara Cuevas 37 y.o. come in for Chronic disease management  ROS: See pertinent positives and negatives per HPI.  Past Medical History:  Diagnosis Date   Hx of varicella     Family History  Problem Relation Age of Onset   Heart disease Father 18       Open-heart surgery   Diabetes type II Other        Parent,Grandparent   Arthritis Other        Parent   ADD / ADHD Brother     Social History   Socioeconomic History   Marital status: Single    Spouse name: Not on file   Number of children: Not on file   Years of education: Not on file   Highest education level: Not on file  Occupational History   Not on file  Tobacco Use   Smoking status: Never   Smokeless tobacco: Never  Vaping Use   Vaping Use: Never used  Substance and Sexual Activity   Alcohol use: Yes    Comment: socially   Drug use: No   Sexual activity: Not on file  Other Topics Concern   Not on file  Social History Narrative   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   Social Determinants of Health   Financial Resource Strain: Not on file  Food Insecurity: Not on file  Transportation Needs: Not on file  Physical Activity: Not on file  Stress: Not on file  Social Connections: Not on file    Outpatient Medications Prior to Visit  Medication Sig Dispense Refill   amphetamine-dextroamphetamine (ADDERALL XR) 25 MG 24 hr capsule Take 1 capsule by mouth every morning. Fill first 30 capsule 0   amphetamine-dextroamphetamine (ADDERALL XR) 25 MG 24 hr capsule Take 1 capsule by mouth every morning. Fill second 30 capsule 0   amphetamine-dextroamphetamine (ADDERALL XR) 25 MG 24 hr capsule Take 1 capsule by mouth every morning. Fill last  30 capsule 0   tretinoin (RETIN-A) 0.1 % cream Apply topically at bedtime. For acne (Patient not taking: Reported on 07/14/2022) 45 g 2   No facility-administered medications prior to visit.     EXAM:  There were no vitals taken for this visit.  There is no height or weight on file to calculate BMI.  GENERAL: vitals reviewed and listed above, alert, oriented, appears well hydrated and in no acute distress HEENT: atraumatic, conjunctiva  clear, no obvious abnormalities on inspection of external nose and ears OP : no lesion edema or exudate  NECK: no obvious masses on inspection palpation  LUNGS: clear to auscultation bilaterally, no wheezes, rales or rhonchi, good air movement CV: HRRR, no clubbing cyanosis or  peripheral edema nl cap refill  MS: moves all extremities without noticeable focal  abnormality PSYCH: pleasant and cooperative, no obvious depression or anxiety Lab Results  Component Value Date   WBC 5.2 10/04/2020   HGB 13.3 10/04/2020   HCT 39.4 10/04/2020   PLT 240 10/04/2020   GLUCOSE 98 10/04/2020   CHOL 181 10/04/2020   TRIG 84 10/04/2020   HDL 81 10/04/2020  LDLCALC 83 10/04/2020   ALT 12 10/04/2020   AST 12 10/04/2020   NA 136 10/04/2020   K 4.3 10/04/2020   CL 103 10/04/2020   CREATININE 0.72 10/04/2020   BUN 15 10/04/2020   CO2 22 10/04/2020   TSH 1.28 10/04/2020   HGBA1C 5.2 10/04/2020   BP Readings from Last 3 Encounters:  07/14/22 110/76  01/14/22 112/80  07/14/21 106/70    ASSESSMENT AND PLAN:  Discussed the following assessment and plan:  No diagnosis found.  -Patient advised to return or notify health care team  if  new concerns arise.  There are no Patient Instructions on file for this visit.   Standley Brooking. Suleyman Ehrman M.D.

## 2023-02-23 ENCOUNTER — Encounter: Payer: Self-pay | Admitting: Internal Medicine

## 2023-02-23 ENCOUNTER — Ambulatory Visit (INDEPENDENT_AMBULATORY_CARE_PROVIDER_SITE_OTHER): Payer: PRIVATE HEALTH INSURANCE | Admitting: Internal Medicine

## 2023-02-23 VITALS — BP 120/79 | HR 82 | Temp 97.6°F | Ht 65.75 in | Wt 133.6 lb

## 2023-02-23 DIAGNOSIS — Z79899 Other long term (current) drug therapy: Secondary | ICD-10-CM | POA: Diagnosis not present

## 2023-02-23 DIAGNOSIS — R634 Abnormal weight loss: Secondary | ICD-10-CM | POA: Diagnosis not present

## 2023-02-23 DIAGNOSIS — F988 Other specified behavioral and emotional disorders with onset usually occurring in childhood and adolescence: Secondary | ICD-10-CM

## 2023-02-23 DIAGNOSIS — Z566 Other physical and mental strain related to work: Secondary | ICD-10-CM

## 2023-02-23 MED ORDER — LISDEXAMFETAMINE DIMESYLATE 20 MG PO CAPS: 20.0000 mg | ORAL_CAPSULE | Freq: Every day | ORAL | 0 refills | Status: DC

## 2023-02-23 NOTE — Patient Instructions (Signed)
Stop the adderall xr for now Try vyvanse 20 mg per day  Let us know after 2 weeks   how doing  Consider increase to 30 mg and then 40 mg as appropriate.  Vyvanse lasts longer and less likely to cause  weight changes and mood swings.

## 2023-04-01 ENCOUNTER — Encounter: Payer: Self-pay | Admitting: Internal Medicine

## 2023-04-01 NOTE — Telephone Encounter (Signed)
Ok  so lets increase to 30 mg per day Vyvanse and see how that goes . ( 20 mg is a low dose) If you agree confirm which pharmacy.

## 2023-04-05 MED ORDER — LISDEXAMFETAMINE DIMESYLATE 30 MG PO CAPS: 30.0000 mg | ORAL_CAPSULE | Freq: Every day | ORAL | 0 refills | Status: DC

## 2023-04-05 NOTE — Telephone Encounter (Signed)
30 mg send in electronically.

## 2023-05-14 ENCOUNTER — Other Ambulatory Visit: Payer: Self-pay | Admitting: Internal Medicine

## 2023-05-14 MED ORDER — LISDEXAMFETAMINE DIMESYLATE 30 MG PO CAPS: 30.0000 mg | ORAL_CAPSULE | Freq: Every day | ORAL | 0 refills | Status: DC

## 2023-06-16 ENCOUNTER — Encounter: Payer: Self-pay | Admitting: Internal Medicine

## 2023-06-16 MED ORDER — LISDEXAMFETAMINE DIMESYLATE 30 MG PO CAPS: 30.0000 mg | ORAL_CAPSULE | Freq: Every day | ORAL | 0 refills | Status: DC

## 2023-07-14 ENCOUNTER — Other Ambulatory Visit: Payer: Self-pay | Admitting: Internal Medicine

## 2023-07-14 MED ORDER — LISDEXAMFETAMINE DIMESYLATE 30 MG PO CAPS: 30.0000 mg | ORAL_CAPSULE | Freq: Every day | ORAL | 0 refills | Status: DC

## 2023-07-15 ENCOUNTER — Encounter (INDEPENDENT_AMBULATORY_CARE_PROVIDER_SITE_OTHER): Payer: Self-pay

## 2023-08-11 NOTE — Progress Notes (Unsigned)
No chief complaint on file.   HPI: Patient  Barbara Cuevas  37 y.o. comes in today for Preventive Health Care visit  And med check   Health Maintenance  Topic Date Due   COVID-19 Vaccine (3 - Moderna risk series) 03/27/2020   INFLUENZA VACCINE  07/01/2023   PAP SMEAR-Modifier  03/05/2024   DTaP/Tdap/Td (8 - Td or Tdap) 07/14/2032   Hepatitis C Screening  Completed   HIV Screening  Completed   HPV VACCINES  Aged Out   Health Maintenance Review LIFESTYLE:  Exercise:   Tobacco/ETS: Alcohol:  Sugar beverages: Sleep: Drug use: no HH of  Work:    ROS:  GEN/ HEENT: No fever, significant weight changes sweats headaches vision problems hearing changes, CV/ PULM; No chest pain shortness of breath cough, syncope,edema  change in exercise tolerance. GI /GU: No adominal pain, vomiting, change in bowel habits. No blood in the stool. No significant GU symptoms. SKIN/HEME: ,no acute skin rashes suspicious lesions or bleeding. No lymphadenopathy, nodules, masses.  NEURO/ PSYCH:  No neurologic signs such as weakness numbness. No depression anxiety. IMM/ Allergy: No unusual infections.  Allergy .   REST of 12 system review negative except as per HPI   Past Medical History:  Diagnosis Date   Hx of varicella     No past surgical history on file.  Family History  Problem Relation Age of Onset   Heart disease Father 71       Open-heart surgery   Diabetes type II Other        Parent,Grandparent   Arthritis Other        Parent   ADD / ADHD Brother     Social History   Socioeconomic History   Marital status: Single    Spouse name: Not on file   Number of children: Not on file   Years of education: Not on file   Highest education level: Not on file  Occupational History   Not on file  Tobacco Use   Smoking status: Never   Smokeless tobacco: Never  Vaping Use   Vaping status: Never Used  Substance and Sexual Activity   Alcohol use: Yes    Comment: socially   Drug  use: No   Sexual activity: Not on file  Other Topics Concern   Not on file  Social History Narrative   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   Social Determinants of Health   Financial Resource Strain: Not on file  Food Insecurity: Not on file  Transportation Needs: Not on file  Physical Activity: Not on file  Stress: Not on file  Social Connections: Not on file    Outpatient Medications Prior to Visit  Medication Sig Dispense Refill   lisdexamfetamine (VYVANSE) 30 MG capsule Take 1 capsule (30 mg total) by mouth daily. Dosage change 30 capsule 0   tretinoin (RETIN-A) 0.1 % cream Apply topically at bedtime. For acne (Patient not taking: Reported on 07/14/2022) 45 g 2   No facility-administered medications prior to visit.     EXAM:  There were no vitals taken for this visit.  There is no height or weight  on file to calculate BMI. Wt Readings from Last 3 Encounters:  02/23/23 133 lb 9.6 oz (60.6 kg)  07/14/22 148 lb (67.1 kg)  01/14/22 159 lb 9.6 oz (72.4 kg)    Physical Exam: Vital signs reviewed ZOX:WRUE is a well-developed well-nourished alert cooperative    who appearsr stated age in no acute distress.  HEENT: normocephalic atraumatic , Eyes: PERRL EOM's full, conjunctiva clear, Nares: paten,t no deformity discharge or tenderness., Ears: no deformity EAC's clear TMs with normal landmarks. Mouth: clear OP, no lesions, edema.  Moist mucous membranes. Dentition in adequate repair. NECK: supple without masses, thyromegaly or bruits. CHEST/PULM:  Clear to auscultation and percussion breath sounds equal no wheeze , rales or rhonchi. No chest wall deformities or tenderness. Breast: normal by inspection . No dimpling, discharge, masses,  tenderness or discharge . CV: PMI is nondisplaced, S1 S2 no gallops, murmurs, rubs. Peripheral pulses are full without delay.No JVD .  ABDOMEN: Bowel sounds normal nontender  No guard or rebound, no hepato splenomegal no CVA tenderness.  No hernia. Extremtities:  No clubbing cyanosis or edema, no acute joint swelling or redness no focal atrophy NEURO:  Oriented x3, cranial nerves 3-12 appear to be intact, no obvious focal weakness,gait within normal limits no abnormal reflexes or asymmetrical SKIN: No acute rashes normal turgor, color, no bruising or petechiae. PSYCH: Oriented, good eye contact, no obvious depression anxiety, cognition and judgment appear normal. LN: no cervical axillary inguinal adenopathy  Lab Results  Component Value Date   WBC 5.2 10/04/2020   HGB 13.3 10/04/2020   HCT 39.4 10/04/2020   PLT 240 10/04/2020   GLUCOSE 98 10/04/2020   CHOL 181 10/04/2020   TRIG 84 10/04/2020   HDL 81 10/04/2020   LDLCALC 83 10/04/2020   ALT 12 10/04/2020   AST 12 10/04/2020   NA 136 10/04/2020   K 4.3 10/04/2020   CL 103 10/04/2020   CREATININE 0.72 10/04/2020   BUN 15 10/04/2020   CO2 22 10/04/2020   TSH 1.28 10/04/2020   HGBA1C 5.2 10/04/2020    BP Readings from Last 3 Encounters:  02/23/23 120/79  07/14/22 110/76  01/14/22 112/80    Lab rplan reviewed with patient   ASSESSMENT AND PLAN:  Discussed the following assessment and plan:    ICD-10-CM   1. Visit for preventive health examination  Z00.00     2. Attention deficit disorder, unspecified hyperactivity presence  F98.8     3. Acne vulgaris  L70.0     4. Medication management  Z79.899      No follow-ups on file.  Patient Care Team: Melvine Julin, Neta Mends, MD as PCP - General (Internal Medicine) There are no Patient Instructions on file for this visit.  Neta Mends. Chivon Lepage M.D.

## 2023-08-12 ENCOUNTER — Encounter: Payer: Self-pay | Admitting: Internal Medicine

## 2023-08-12 ENCOUNTER — Ambulatory Visit (INDEPENDENT_AMBULATORY_CARE_PROVIDER_SITE_OTHER): Payer: PRIVATE HEALTH INSURANCE | Admitting: Internal Medicine

## 2023-08-12 ENCOUNTER — Encounter: Payer: PRIVATE HEALTH INSURANCE | Admitting: Internal Medicine

## 2023-08-12 VITALS — BP 94/72 | HR 86 | Temp 98.0°F | Ht 66.0 in | Wt 135.2 lb

## 2023-08-12 DIAGNOSIS — Z0000 Encounter for general adult medical examination without abnormal findings: Secondary | ICD-10-CM

## 2023-08-12 DIAGNOSIS — L700 Acne vulgaris: Secondary | ICD-10-CM

## 2023-08-12 DIAGNOSIS — Z13220 Encounter for screening for lipoid disorders: Secondary | ICD-10-CM

## 2023-08-12 DIAGNOSIS — Z79899 Other long term (current) drug therapy: Secondary | ICD-10-CM | POA: Diagnosis not present

## 2023-08-12 DIAGNOSIS — F988 Other specified behavioral and emotional disorders with onset usually occurring in childhood and adolescence: Secondary | ICD-10-CM

## 2023-08-12 LAB — CBC WITH DIFFERENTIAL/PLATELET
Basophils Absolute: 0 10*3/uL (ref 0.0–0.1)
Basophils Relative: 0.6 % (ref 0.0–3.0)
Eosinophils Absolute: 0 10*3/uL (ref 0.0–0.7)
Eosinophils Relative: 0.2 % (ref 0.0–5.0)
HCT: 42.1 % (ref 36.0–46.0)
Hemoglobin: 13.9 g/dL (ref 12.0–15.0)
Lymphocytes Relative: 22.8 % (ref 12.0–46.0)
Lymphs Abs: 1.3 10*3/uL (ref 0.7–4.0)
MCHC: 32.9 g/dL (ref 30.0–36.0)
MCV: 94.5 fl (ref 78.0–100.0)
Monocytes Absolute: 0.3 10*3/uL (ref 0.1–1.0)
Monocytes Relative: 4.5 % (ref 3.0–12.0)
Neutro Abs: 4.2 10*3/uL (ref 1.4–7.7)
Neutrophils Relative %: 71.9 % (ref 43.0–77.0)
Platelets: 239 10*3/uL (ref 150.0–400.0)
RBC: 4.46 Mil/uL (ref 3.87–5.11)
RDW: 12.3 % (ref 11.5–15.5)
WBC: 5.9 10*3/uL (ref 4.0–10.5)

## 2023-08-12 LAB — LIPID PANEL
Cholesterol: 161 mg/dL (ref 0–200)
HDL: 79.1 mg/dL (ref >39.00)
LDL Cholesterol: 73 mg/dL (ref 0–99)
NonHDL: 81.65
Total CHOL/HDL Ratio: 2
Triglycerides: 42 mg/dL (ref 0.0–149.0)
VLDL: 8.4 mg/dL (ref 0.0–40.0)

## 2023-08-12 LAB — HEMOGLOBIN A1C: Hgb A1c MFr Bld: 5.2 % (ref 4.6–6.5)

## 2023-08-12 LAB — HEPATIC FUNCTION PANEL
ALT: 10 U/L (ref 0–35)
AST: 13 U/L (ref 0–37)
Albumin: 4.8 g/dL (ref 3.5–5.2)
Alkaline Phosphatase: 60 U/L (ref 39–117)
Bilirubin, Direct: 0.4 mg/dL — ABNORMAL HIGH (ref 0.0–0.3)
Total Bilirubin: 2.1 mg/dL — ABNORMAL HIGH (ref 0.2–1.2)
Total Protein: 7.7 g/dL (ref 6.0–8.3)

## 2023-08-12 LAB — BASIC METABOLIC PANEL
BUN: 13 mg/dL (ref 6–23)
CO2: 28 meq/L (ref 19–32)
Calcium: 9.8 mg/dL (ref 8.4–10.5)
Chloride: 100 meq/L (ref 96–112)
Creatinine, Ser: 0.68 mg/dL (ref 0.40–1.20)
GFR: 113.11 mL/min (ref >60.00)
Glucose, Bld: 77 mg/dL (ref 70–99)
Potassium: 3.6 meq/L (ref 3.5–5.1)
Sodium: 138 meq/L (ref 135–145)

## 2023-08-12 LAB — TSH: TSH: 1.07 u[IU]/mL (ref 0.35–5.50)

## 2023-08-12 MED ORDER — LISDEXAMFETAMINE DIMESYLATE 30 MG PO CAPS: 30.0000 mg | ORAL_CAPSULE | Freq: Every day | ORAL | 0 refills | Status: DC

## 2023-08-12 NOTE — Patient Instructions (Addendum)
Good to see  you today  Fasting labs   Will refill vyvanse  30 mg  today  If all ok then  6 months med check or as indicated

## 2023-08-15 LAB — LIPOPROTEIN A (LPA): Lipoprotein (a): 48 nmol/L (ref <75)

## 2023-08-16 NOTE — Progress Notes (Signed)
Lipo A is favorable.   Lab in range except the Total bili but that could have been from prolonged fast. And not related to liver concerns .     I suggest  getting a repeat lfts  panel when  well hydrated , in next few months ( no rush)

## 2023-08-23 ENCOUNTER — Other Ambulatory Visit: Payer: Self-pay

## 2023-08-23 DIAGNOSIS — Z79899 Other long term (current) drug therapy: Secondary | ICD-10-CM

## 2023-09-07 ENCOUNTER — Other Ambulatory Visit: Payer: Self-pay | Admitting: Internal Medicine

## 2023-09-09 MED ORDER — LISDEXAMFETAMINE DIMESYLATE 30 MG PO CAPS: 30.0000 mg | ORAL_CAPSULE | Freq: Every day | ORAL | 0 refills | Status: DC

## 2023-10-07 ENCOUNTER — Encounter: Payer: Self-pay | Admitting: Internal Medicine

## 2023-10-09 ENCOUNTER — Other Ambulatory Visit: Payer: Self-pay | Admitting: Family

## 2023-10-09 MED ORDER — LISDEXAMFETAMINE DIMESYLATE 30 MG PO CAPS: 30.0000 mg | ORAL_CAPSULE | Freq: Every day | ORAL | 0 refills | Status: DC

## 2023-10-12 NOTE — Telephone Encounter (Signed)
Spoke to pt.   Lab appt schedule for 10/15/2023.   Pt is aware Rx was sent.

## 2023-10-12 NOTE — Telephone Encounter (Signed)
Attempted to reach pt. Left a voicemail to call us back.  

## 2023-10-15 ENCOUNTER — Other Ambulatory Visit (INDEPENDENT_AMBULATORY_CARE_PROVIDER_SITE_OTHER): Payer: PRIVATE HEALTH INSURANCE

## 2023-10-15 DIAGNOSIS — Z79899 Other long term (current) drug therapy: Secondary | ICD-10-CM

## 2023-10-15 LAB — HEPATIC FUNCTION PANEL
ALT: 11 U/L (ref 0–35)
AST: 13 U/L (ref 0–37)
Albumin: 4.7 g/dL (ref 3.5–5.2)
Alkaline Phosphatase: 52 U/L (ref 39–117)
Bilirubin, Direct: 0.3 mg/dL (ref 0.0–0.3)
Total Bilirubin: 1.6 mg/dL — ABNORMAL HIGH (ref 0.2–1.2)
Total Protein: 7 g/dL (ref 6.0–8.3)

## 2023-10-18 NOTE — Progress Notes (Signed)
Bilirubin is better  prob a benign finding . Follow  no further action at this time

## 2023-11-09 ENCOUNTER — Other Ambulatory Visit: Payer: Self-pay | Admitting: Family

## 2023-11-09 ENCOUNTER — Encounter: Payer: Self-pay | Admitting: Internal Medicine

## 2023-11-09 MED ORDER — LISDEXAMFETAMINE DIMESYLATE 30 MG PO CAPS: 30.0000 mg | ORAL_CAPSULE | Freq: Every day | ORAL | 0 refills | Status: DC

## 2023-12-08 ENCOUNTER — Encounter: Payer: Self-pay | Admitting: Internal Medicine

## 2023-12-09 MED ORDER — LISDEXAMFETAMINE DIMESYLATE 30 MG PO CAPS: 30.0000 mg | ORAL_CAPSULE | Freq: Every day | ORAL | 0 refills | Status: DC

## 2023-12-10 ENCOUNTER — Other Ambulatory Visit: Payer: Self-pay | Admitting: Family

## 2024-01-07 ENCOUNTER — Encounter: Payer: Self-pay | Admitting: Internal Medicine

## 2024-01-07 ENCOUNTER — Other Ambulatory Visit: Payer: Self-pay | Admitting: Family

## 2024-01-07 MED ORDER — LISDEXAMFETAMINE DIMESYLATE 30 MG PO CAPS: 30.0000 mg | ORAL_CAPSULE | Freq: Every day | ORAL | 0 refills | Status: DC

## 2024-02-09 ENCOUNTER — Telehealth: Payer: PRIVATE HEALTH INSURANCE | Admitting: Internal Medicine

## 2024-02-09 ENCOUNTER — Encounter: Payer: Self-pay | Admitting: Internal Medicine

## 2024-02-09 DIAGNOSIS — F988 Other specified behavioral and emotional disorders with onset usually occurring in childhood and adolescence: Secondary | ICD-10-CM

## 2024-02-09 DIAGNOSIS — Z79899 Other long term (current) drug therapy: Secondary | ICD-10-CM

## 2024-02-09 MED ORDER — LISDEXAMFETAMINE DIMESYLATE 30 MG PO CAPS: 30.0000 mg | ORAL_CAPSULE | Freq: Every day | ORAL | 0 refills | Status: DC

## 2024-02-09 NOTE — Progress Notes (Signed)
 Virtual Visit via Video Note  I connected with Barbara Cuevas on 02/09/24 at 11:30 AM EDT by a video enabled telemedicine application and verified that I am speaking with the correct person using two identifiers. Location patient: home Location provider:work office Persons participating in the virtual visit: patient, provider   Patient aware  of the limitations of evaluation and management by telemedicine and  availability of in person appointments. and agreed to proceed.   HPI: Barbara Cuevas presents for video visit for med check for adhd med Vyvanse 30 mg  Feels is doing well and Takes every day  unless forgets  No untoward se . Mood anxiety Sleep 7-8 neg td  Feels that adderall  may have been causing anxiety because that is better on vyanse    ROS: See pertinent positives and negatives per HPI. No new sx of concern  Past Medical History:  Diagnosis Date   Contraception management 07/21/2012   Hx of varicella    Oral contraceptive pill surveillance 07/22/2018    History reviewed. No pertinent surgical history.  Family History  Problem Relation Age of Onset   Heart disease Father 4       Open-heart surgery   Diabetes type II Other        Parent,Grandparent   Arthritis Other        Parent   ADD / ADHD Brother     Social History   Tobacco Use   Smoking status: Never   Smokeless tobacco: Never  Vaping Use   Vaping status: Never Used  Substance Use Topics   Alcohol use: Yes    Comment: socially   Drug use: No      Current Outpatient Medications:    lisdexamfetamine (VYVANSE) 30 MG capsule, Take 1 capsule (30 mg total) by mouth daily. Dosage change, Disp: 30 capsule, Rfl: 0   tretinoin (RETIN-A) 0.1 % cream, Apply topically at bedtime. For acne (Patient not taking: Reported on 02/09/2024), Disp: 45 g, Rfl: 2  EXAM: BP Readings from Last 3 Encounters:  08/12/23 94/72  02/23/23 120/79  07/14/22 110/76    VITALS per patient if applicable:  GENERAL:  alert, oriented, appears well and in no acute distress  HEENT: atraumatic, conjunttiva clear, no obvious abnormalities on inspection of external nose and ears  NECK: normal movements of the head and neck  LUNGS: on inspection no signs of respiratory distress, breathing rate appears normal, no obvious gross SOB, gasping or wheezing  CV: no obvious cyanosis  MS: moves all visible extremities without noticeable abnormality  PSYCH/NEURO: pleasant and cooperative, no obvious depression or anxiety, speech and thought processing grossly intact Lab Results  Component Value Date   WBC 5.9 08/12/2023   HGB 13.9 08/12/2023   HCT 42.1 08/12/2023   PLT 239.0 08/12/2023   GLUCOSE 77 08/12/2023   CHOL 161 08/12/2023   TRIG 42.0 08/12/2023   HDL 79.10 08/12/2023   LDLCALC 73 08/12/2023   ALT 11 10/15/2023   AST 13 10/15/2023   NA 138 08/12/2023   K 3.6 08/12/2023   CL 100 08/12/2023   CREATININE 0.68 08/12/2023   BUN 13 08/12/2023   CO2 28 08/12/2023   TSH 1.07 08/12/2023   HGBA1C 5.2 08/12/2023    ASSESSMENT AND PLAN:  Discussed the following assessment and plan:    ICD-10-CM   1. Attention deficit disorder, unspecified type  F98.8     2. Medication management  Z79.899     Benefit more than risk of  medication  to continue.  Vyvnase lessens anxiety  poss from adderall  rx . Continue  refill today and for 6 months until cpe  or appt in person in about 6 months  Contact for refills   Counseled.   Expectant management and discussion of plan and treatment with opportunity to ask questions and all were answered. The patient agreed with the plan and demonstrated an understanding of the instructions.   Advised to call back or seek an in-person evaluation if  further concerns  in interim. Return in about 6 months (around 08/11/2024) for cpe and  med check .    Berniece Andreas, MD

## 2024-03-08 ENCOUNTER — Other Ambulatory Visit: Payer: Self-pay | Admitting: Internal Medicine

## 2024-03-08 MED ORDER — LISDEXAMFETAMINE DIMESYLATE 30 MG PO CAPS: 30.0000 mg | ORAL_CAPSULE | Freq: Every day | ORAL | 0 refills | Status: DC

## 2024-04-13 ENCOUNTER — Other Ambulatory Visit: Payer: Self-pay | Admitting: Internal Medicine

## 2024-04-14 MED ORDER — LISDEXAMFETAMINE DIMESYLATE 30 MG PO CAPS: 30.0000 mg | ORAL_CAPSULE | Freq: Every day | ORAL | 0 refills | Status: DC

## 2024-05-15 ENCOUNTER — Encounter: Payer: Self-pay | Admitting: Internal Medicine

## 2024-05-16 ENCOUNTER — Other Ambulatory Visit: Payer: Self-pay | Admitting: Family

## 2024-05-16 MED ORDER — LISDEXAMFETAMINE DIMESYLATE 30 MG PO CAPS: 30.0000 mg | ORAL_CAPSULE | Freq: Every day | ORAL | 0 refills | Status: DC

## 2024-06-16 ENCOUNTER — Encounter: Payer: Self-pay | Admitting: Internal Medicine

## 2024-06-19 ENCOUNTER — Other Ambulatory Visit: Payer: Self-pay | Admitting: Family

## 2024-06-19 MED ORDER — LISDEXAMFETAMINE DIMESYLATE 30 MG PO CAPS: 30.0000 mg | ORAL_CAPSULE | Freq: Every day | ORAL | 0 refills | Status: DC

## 2024-07-20 ENCOUNTER — Encounter: Payer: Self-pay | Admitting: Internal Medicine

## 2024-07-21 MED ORDER — LISDEXAMFETAMINE DIMESYLATE 30 MG PO CAPS: 30.0000 mg | ORAL_CAPSULE | Freq: Every day | ORAL | 0 refills | Status: AC

## 2024-08-24 ENCOUNTER — Encounter: Payer: PRIVATE HEALTH INSURANCE | Admitting: Internal Medicine

## 2024-09-06 ENCOUNTER — Ambulatory Visit: Payer: PRIVATE HEALTH INSURANCE | Admitting: Internal Medicine

## 2024-09-06 ENCOUNTER — Encounter: Payer: Self-pay | Admitting: Internal Medicine

## 2024-09-06 VITALS — BP 98/64 | HR 94 | Temp 98.2°F | Ht 65.75 in | Wt 179.4 lb

## 2024-09-06 DIAGNOSIS — F909 Attention-deficit hyperactivity disorder, unspecified type: Secondary | ICD-10-CM | POA: Diagnosis not present

## 2024-09-06 DIAGNOSIS — N912 Amenorrhea, unspecified: Secondary | ICD-10-CM

## 2024-09-06 DIAGNOSIS — Z0000 Encounter for general adult medical examination without abnormal findings: Secondary | ICD-10-CM | POA: Diagnosis not present

## 2024-09-06 DIAGNOSIS — Z3201 Encounter for pregnancy test, result positive: Secondary | ICD-10-CM

## 2024-09-06 LAB — POCT URINE PREGNANCY: Preg Test, Ur: POSITIVE — AB

## 2024-09-06 NOTE — Progress Notes (Signed)
 Chief Complaint  Patient presents with   Annual Exam    HPI: Patient  Barbara Cuevas  38 y.o. comes in today for Preventive Health Care visit   Switched job  4 days per week.  10 hours x 4 . Days  not sure she likes this . No injuries  Off all meds to try off  Had pos preg test  2 weeks ago   but neg wees before  no method  partner aware .  Fatigue but no nv supplements .  Periods  very irreg baseline lmp she states  April?  May only have  4-5 per year off of ocps .  No contraceptive  use recently .  Health Maintenance  Topic Date Due   Hepatitis B Vaccines 19-59 Average Risk (1 of 3 - 19+ 3-dose series) Never done   HPV VACCINES (1 - 3-dose SCDM series) Never done   COVID-19 Vaccine (3 - Moderna risk series) 09/22/2024 (Originally 03/27/2020)   Influenza Vaccine  02/27/2025 (Originally 06/30/2024)   Cervical Cancer Screening (HPV/Pap Cotest)  03/05/2026   DTaP/Tdap/Td (8 - Td or Tdap) 07/14/2032   Hepatitis C Screening  Completed   HIV Screening  Completed   Pneumococcal Vaccine  Aged Out   Meningococcal B Vaccine  Aged Out   Health Maintenance Review LIFESTYLE:  Exercise:  work  outside time  Tobacco/ETS: n Alcohol:  ocass  Sugar beverages: Sleep:  at least 7-8  Drug use: no HH of  1 dog  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 GI /GU: No adominal pain, vomiting, change in bowel habits. No blood in the stool. No significant GU symptoms. SKIN/HEME: ,no acute skin rashes suspicious lesions or bleeding. No lymphadenopathy, nodules, masses.  NEURO/ PSYCH:  No neurologic signs such as weakness numbness. No depression anxiety. IMM/ Allergy: No unusual infections.  Allergy .   REST of 12 system review negative except as per HPI Periods  always off  and none for   4-5 months .  Past Medical History:  Diagnosis Date   Contraception management 07/21/2012   Hx of varicella    Oral contraceptive pill surveillance 07/22/2018    History reviewed. No pertinent surgical history.  Family History  Problem Relation Age of Onset   Heart disease Father 19       Open-heart surgery   Diabetes type II Other        Parent,Grandparent   Arthritis Other        Parent   ADD / ADHD Brother     Social History   Socioeconomic History   Marital status: Single    Spouse name: Not on file   Number of children: Not on file   Years of education: Not on file   Highest education level: Associate degree: occupational, Scientist, product/process development, or vocational program  Occupational History   Not on file  Tobacco Use   Smoking status: Never   Smokeless tobacco: Never  Vaping Use   Vaping status: Never Used  Substance and Sexual Activity   Alcohol use: Yes    Comment: socially   Drug use: No   Sexual activity: Not on file  Other Topics Concern   Not on file  Social History Narrative   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   Social Drivers of Health   Financial Resource Strain: Medium Risk (08/30/2024)   Overall Financial Resource Strain (CARDIA)    Difficulty of Paying Living Expenses: Somewhat hard  Food Insecurity: Food Insecurity Present (08/30/2024)   Hunger Vital Sign    Worried About Running Out of Food in the Last Year: Sometimes true    Ran Out of Food in the Last Year: Never true  Transportation Needs: No Transportation Needs (08/30/2024)   PRAPARE - Administrator, Civil Service (Medical): No    Lack of Transportation (Non-Medical): No  Physical Activity: Unknown (08/30/2024)   Exercise Vital Sign    Days of  Exercise per Week: 4 days    Minutes of Exercise per Session: Patient declined  Stress: No Stress Concern Present (08/30/2024)   [employer] of Occupational Health - Occupational Stress Questionnaire    Feeling of Stress: Only a little  Social Connections: Moderately Integrated (08/30/2024)   Social Connection and Isolation Panel    Frequency of Communication with Friends and Family: Patient declined    Frequency of Social Gatherings with Friends and Family: More than three times a week    Attends Religious Services: More than 4 times per year    Active Member of [employer] or Organizations: Yes    Attends Engineer, structural: More than 4 times per year    Marital Status: Never married    Outpatient Medications Prior to Visit  Medication Sig Dispense Refill   lisdexamfetamine (VYVANSE ) 30 MG capsule Take 1 capsule (30 mg total) by mouth daily. Dosage change (Patient not taking: Reported on 09/06/2024) 30 capsule 0   tretinoin  (RETIN-A ) 0.1 % cream Apply topically at bedtime. For acne (Patient not taking: Reported on 09/06/2024) 45 g 2   No facility-administered medications prior to visit.     EXAM:  BP 98/64 (BP Location: Left Arm, Patient Position: Sitting, Cuff Size: Large)   Pulse 94   Temp 98.2 F (36.8 C) (Oral)   Ht 5' 5.75 (1.67 m)   Wt 179 lb 6.4 oz (81.4 kg)   LMP 03/29/2024 (Approximate)   SpO2 97%   BMI 29.18 kg/m   Body mass index is 29.18 kg/m. Wt Readings from Last 3 Encounters:  09/06/24 179 lb 6.4 oz (81.4 kg)  08/12/23 135 lb 3.2 oz (61.3 kg)  02/23/23 133 lb 9.6 oz (60.6 kg)    Physical Exam: Vital signs reviewed [id number]:Uypd is a well-developed well-nourished alert cooperative    who appearsr stated age in no acute distress.  HEENT: normocephalic atraumatic , Eyes: PERRL EOM's full, conjunctiva clear, Nares: paten,t no deformity discharge or tenderness., Ears: no deformity EAC's clear TMs with normal landmarks. Mouth: clear OP, no lesions, edema.   Moist mucous membranes. Dentition in adequate repair. NECK: supple without masses, thyromegaly or bruits. CHEST/PULM:  Clear to auscultation and percussion breath sounds equal no wheeze , rales or rhonchi. No chest wall deformities or tenderness. Breast: normal by inspection . No dimpling, discharge, masses, tenderness or discharge . CV: PMI is nondisplaced, S1 S2 no gallops, murmurs, rubs. Peripheral pulses are full without delay.No JVD .  ABDOMEN: Bowel sounds normal nontender  No guard or rebound, no hepato splenomegal no CVA tenderness.   ? Uterine fundi  just under umbilicus  ?no vascular sounds w reg stethescope   Extremtities:  No clubbing cyanosis or edema, no acute joint swelling or redness no focal atrophy slight trace edema NEURO:  Oriented x3, cranial nerves 3-12 appear to be intact, no obvious focal weakness,gait within normal limits no abnormal reflexes or asymmetrical SKIN: No acute rashes normal turgor, color, no bruising or petechiae. PSYCH: Oriented, good eye contact, no obvious depression anxiety, cognition and judgment appear normal. LN: no cervical axillary i adenopathy  Lab Results  Component Value Date   WBC 5.9 08/12/2023   HGB 13.9 08/12/2023   HCT 42.1 08/12/2023   PLT 239.0 08/12/2023   GLUCOSE 77 08/12/2023   CHOL 161 08/12/2023   TRIG 42.0 08/12/2023   HDL 79.10 08/12/2023   LDLCALC 73 08/12/2023   ALT 11 10/15/2023   AST 13 10/15/2023   NA 138 08/12/2023   K 3.6 08/12/2023   CL 100 08/12/2023   CREATININE 0.68 08/12/2023   BUN 13 08/12/2023   CO2 28 08/12/2023   TSH 1.07 08/12/2023   HGBA1C 5.2 08/12/2023    BP Readings from Last 3 Encounters:  09/06/24 98/64  08/12/23 94/72  02/23/23 120/79   Last pap 4 8 22  neg cytology neg HPV Preg urine test positive   ASSESSMENT AND PLAN:  Discussed the following assessment and plan:    ICD-10-CM   1. Visit for preventive health examination  Z00.00     2. Amenorrhea  N91.2 POCT urine pregnancy     3. Pregnancy test positive  Z32.01     4. Attention deficit hyperactivity disorder (ADHD), unspecified ADHD type  F90.9    off all meds    Reports that pos preg 2 weeks ago  and negative a month ago? Uncertain dates   with hx of  irreg periods .doesn't have  dates of last periods   Exam  seems consistent with later than 12 weeks  Off all meds  [facility] appt  needs US  for dates  Begin  prenatals and /with folic acid .  Agree off all meds x vits folate Get back with us  if we need to do a referral or help in any way  Return for as indicated.  Patient Care Team: Leanne Sisler K, MD as PCP - General (Internal Medicine) Patient Instructions  Get appt  with [facility]  . Will need US   .  Begin  folic acid   prenatal vitamins.  Ok to get flu vaccine at work  Rest as per advice of OB.     Jari Dipasquale K. Noe Goyer M.D.

## 2024-09-06 NOTE — Patient Instructions (Addendum)
 Get appt  with [facility]  . Will need US   .  Begin  folic acid   prenatal vitamins.  Ok to get flu vaccine at work  Rest as per advice of OB.

## 2024-09-11 ENCOUNTER — Encounter: Payer: Self-pay | Admitting: Internal Medicine

## 2024-09-11 DIAGNOSIS — N912 Amenorrhea, unspecified: Secondary | ICD-10-CM

## 2024-09-11 DIAGNOSIS — Z3201 Encounter for pregnancy test, result positive: Secondary | ICD-10-CM

## 2024-09-12 NOTE — Telephone Encounter (Signed)
 Thanks   make sure they know unknown dates  and lmp 5 months ago ,Let me know if she can't get in  soon she is  probably in second trimester

## 2024-09-13 NOTE — Telephone Encounter (Signed)
 Contacted [facility] [phone]. Spoke to Dalia, she states referral coordinator is at lunch but may leave a message. After transfer, call got disconnected.

## 2024-09-28 NOTE — Telephone Encounter (Signed)
 Ehr  had visit  in [facility] and est 28 weeks

## 2024-09-28 NOTE — Telephone Encounter (Signed)
 Mom has told me she was to have appt this week

## 2024-11-01 ENCOUNTER — Telehealth: Payer: Self-pay | Admitting: Internal Medicine

## 2024-11-01 NOTE — Telephone Encounter (Unsigned)
 Copied from CRM #[id number]. Topic: Clinical - Order For Equipment >> Nov 01, 2024 12:25 PM Dedra B wrote: Reason for CRM: John from [employer] calling to see if Dr. Charlett would be willing to sign order for breast pump for pt. He will also fax order. Ref number: [id number]

## 2024-11-07 NOTE — Telephone Encounter (Signed)
 Contacted [facility] and inform them pt sees OBGYN and if they can fax the form to pt's OBGYN. A representative updates they did contact Dr. Jimmie office - pt's obgyn, they have received documentation from them.  No further action is needed at this time.
